# Patient Record
Sex: Female | Born: 1985 | Race: Black or African American | Hispanic: No | Smoking: Former smoker
Health system: Southern US, Community
[De-identification: ages and names within clinical notes are randomized; demographics above are authoritative.]

## PROBLEM LIST (undated history)

## (undated) ENCOUNTER — Inpatient Hospital Stay (HOSPITAL_COMMUNITY): Payer: Self-pay

## (undated) DIAGNOSIS — A599 Trichomoniasis, unspecified: Secondary | ICD-10-CM

## (undated) DIAGNOSIS — G4733 Obstructive sleep apnea (adult) (pediatric): Secondary | ICD-10-CM

## (undated) DIAGNOSIS — N390 Urinary tract infection, site not specified: Secondary | ICD-10-CM

## (undated) DIAGNOSIS — N760 Acute vaginitis: Secondary | ICD-10-CM

## (undated) DIAGNOSIS — I10 Essential (primary) hypertension: Secondary | ICD-10-CM

## (undated) DIAGNOSIS — O139 Gestational [pregnancy-induced] hypertension without significant proteinuria, unspecified trimester: Secondary | ICD-10-CM

## (undated) DIAGNOSIS — F32A Depression, unspecified: Secondary | ICD-10-CM

## (undated) DIAGNOSIS — B9689 Other specified bacterial agents as the cause of diseases classified elsewhere: Secondary | ICD-10-CM

## (undated) DIAGNOSIS — G43909 Migraine, unspecified, not intractable, without status migrainosus: Secondary | ICD-10-CM

## (undated) DIAGNOSIS — Z98891 History of uterine scar from previous surgery: Secondary | ICD-10-CM

## (undated) DIAGNOSIS — E221 Hyperprolactinemia: Secondary | ICD-10-CM

## (undated) DIAGNOSIS — N12 Tubulo-interstitial nephritis, not specified as acute or chronic: Secondary | ICD-10-CM

## (undated) DIAGNOSIS — F319 Bipolar disorder, unspecified: Secondary | ICD-10-CM

## (undated) DIAGNOSIS — E039 Hypothyroidism, unspecified: Secondary | ICD-10-CM

## (undated) DIAGNOSIS — D649 Anemia, unspecified: Secondary | ICD-10-CM

## (undated) DIAGNOSIS — F429 Obsessive-compulsive disorder, unspecified: Secondary | ICD-10-CM

## (undated) DIAGNOSIS — F913 Oppositional defiant disorder: Secondary | ICD-10-CM

## (undated) DIAGNOSIS — N611 Abscess of the breast and nipple: Secondary | ICD-10-CM

## (undated) DIAGNOSIS — K219 Gastro-esophageal reflux disease without esophagitis: Secondary | ICD-10-CM

## (undated) DIAGNOSIS — B009 Herpesviral infection, unspecified: Secondary | ICD-10-CM

## (undated) DIAGNOSIS — Z87448 Personal history of other diseases of urinary system: Secondary | ICD-10-CM

## (undated) DIAGNOSIS — Z86711 Personal history of pulmonary embolism: Secondary | ICD-10-CM

## (undated) DIAGNOSIS — R51 Headache: Secondary | ICD-10-CM

## (undated) DIAGNOSIS — F329 Major depressive disorder, single episode, unspecified: Secondary | ICD-10-CM

## (undated) DIAGNOSIS — G932 Benign intracranial hypertension: Secondary | ICD-10-CM

## (undated) DIAGNOSIS — J302 Other seasonal allergic rhinitis: Secondary | ICD-10-CM

## (undated) DIAGNOSIS — F419 Anxiety disorder, unspecified: Secondary | ICD-10-CM

## (undated) HISTORY — PX: ADENOIDECTOMY: SHX5191

## (undated) HISTORY — PX: WISDOM TOOTH EXTRACTION: SHX21

## (undated) HISTORY — DX: Migraine, unspecified, not intractable, without status migrainosus: G43.909

## (undated) HISTORY — DX: Headache: R51

---
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
## 2007-01-19 ENCOUNTER — Emergency Department (HOSPITAL_COMMUNITY): Admission: EM | Admit: 2007-01-19 | Discharge: 2007-01-20 | Payer: Self-pay | Admitting: Emergency Medicine

## 2007-02-17 HISTORY — PX: CHOLECYSTECTOMY: SHX55

## 2008-02-17 HISTORY — PX: OTHER SURGICAL HISTORY: SHX169

## 2008-04-23 ENCOUNTER — Emergency Department (HOSPITAL_COMMUNITY): Admission: EM | Admit: 2008-04-23 | Discharge: 2008-04-24 | Payer: Self-pay | Admitting: Emergency Medicine

## 2008-07-28 ENCOUNTER — Ambulatory Visit (HOSPITAL_COMMUNITY): Admission: RE | Admit: 2008-07-28 | Discharge: 2008-07-28 | Payer: Self-pay | Admitting: Obstetrics and Gynecology

## 2008-09-11 ENCOUNTER — Ambulatory Visit: Payer: Self-pay | Admitting: Family Medicine

## 2008-09-13 DIAGNOSIS — F319 Bipolar disorder, unspecified: Secondary | ICD-10-CM | POA: Insufficient documentation

## 2008-09-13 DIAGNOSIS — F429 Obsessive-compulsive disorder, unspecified: Secondary | ICD-10-CM | POA: Insufficient documentation

## 2008-09-13 DIAGNOSIS — F988 Other specified behavioral and emotional disorders with onset usually occurring in childhood and adolescence: Secondary | ICD-10-CM | POA: Insufficient documentation

## 2008-09-13 DIAGNOSIS — F913 Oppositional defiant disorder: Secondary | ICD-10-CM | POA: Insufficient documentation

## 2009-03-08 ENCOUNTER — Ambulatory Visit: Payer: Self-pay | Admitting: Pulmonary Disease

## 2009-03-08 DIAGNOSIS — J3089 Other allergic rhinitis: Secondary | ICD-10-CM

## 2009-03-08 DIAGNOSIS — E039 Hypothyroidism, unspecified: Secondary | ICD-10-CM

## 2009-03-08 DIAGNOSIS — G4733 Obstructive sleep apnea (adult) (pediatric): Secondary | ICD-10-CM | POA: Insufficient documentation

## 2009-03-21 ENCOUNTER — Encounter: Payer: Self-pay | Admitting: Pulmonary Disease

## 2009-03-21 ENCOUNTER — Ambulatory Visit (HOSPITAL_BASED_OUTPATIENT_CLINIC_OR_DEPARTMENT_OTHER): Admission: RE | Admit: 2009-03-21 | Discharge: 2009-03-21 | Payer: Self-pay | Admitting: Pulmonary Disease

## 2009-03-27 ENCOUNTER — Telehealth (INDEPENDENT_AMBULATORY_CARE_PROVIDER_SITE_OTHER): Payer: Self-pay | Admitting: *Deleted

## 2009-03-27 ENCOUNTER — Ambulatory Visit: Payer: Self-pay | Admitting: Pulmonary Disease

## 2009-04-08 ENCOUNTER — Ambulatory Visit: Payer: Self-pay | Admitting: Pulmonary Disease

## 2009-06-20 ENCOUNTER — Telehealth: Payer: Self-pay | Admitting: Pulmonary Disease

## 2009-07-17 ENCOUNTER — Encounter: Payer: Self-pay | Admitting: Pulmonary Disease

## 2009-07-24 ENCOUNTER — Emergency Department (HOSPITAL_COMMUNITY): Admission: EM | Admit: 2009-07-24 | Discharge: 2009-07-24 | Payer: Self-pay | Admitting: Emergency Medicine

## 2009-07-24 ENCOUNTER — Telehealth (INDEPENDENT_AMBULATORY_CARE_PROVIDER_SITE_OTHER): Payer: Self-pay | Admitting: *Deleted

## 2009-10-24 ENCOUNTER — Emergency Department (HOSPITAL_COMMUNITY): Admission: EM | Admit: 2009-10-24 | Discharge: 2009-10-24 | Payer: Self-pay | Admitting: Emergency Medicine

## 2009-12-29 ENCOUNTER — Emergency Department (HOSPITAL_COMMUNITY): Admission: EM | Admit: 2009-12-29 | Discharge: 2009-12-29 | Payer: Self-pay | Admitting: Emergency Medicine

## 2009-12-30 ENCOUNTER — Encounter: Admission: RE | Admit: 2009-12-30 | Discharge: 2009-12-30 | Payer: Self-pay | Admitting: Family Medicine

## 2009-12-31 ENCOUNTER — Emergency Department (HOSPITAL_COMMUNITY): Admission: EM | Admit: 2009-12-31 | Discharge: 2009-12-31 | Payer: Self-pay | Admitting: Emergency Medicine

## 2010-02-06 ENCOUNTER — Encounter
Admission: RE | Admit: 2010-02-06 | Discharge: 2010-02-06 | Payer: Self-pay | Source: Home / Self Care | Attending: Family Medicine | Admitting: Family Medicine

## 2010-02-06 ENCOUNTER — Ambulatory Visit (HOSPITAL_COMMUNITY): Admission: RE | Admit: 2010-02-06 | Discharge: 2010-02-07 | Payer: Self-pay | Source: Home / Self Care

## 2010-03-08 ENCOUNTER — Encounter: Payer: Self-pay | Admitting: Family Medicine

## 2010-03-09 ENCOUNTER — Encounter: Payer: Self-pay | Admitting: Family Medicine

## 2010-03-13 ENCOUNTER — Emergency Department (HOSPITAL_COMMUNITY)
Admission: EM | Admit: 2010-03-13 | Discharge: 2010-03-13 | Payer: Self-pay | Source: Home / Self Care | Admitting: Emergency Medicine

## 2010-03-13 LAB — BASIC METABOLIC PANEL
BUN: 3 mg/dL — ABNORMAL LOW (ref 6–23)
CO2: 24 mEq/L (ref 19–32)
Calcium: 9.7 mg/dL (ref 8.4–10.5)
Chloride: 106 mEq/L (ref 96–112)
Creatinine, Ser: 0.8 mg/dL (ref 0.4–1.2)
GFR calc Af Amer: >60 mL/min (ref >60)
GFR calc non Af Amer: >60 mL/min (ref >60)
Glucose, Bld: 104 mg/dL — ABNORMAL HIGH (ref 70–99)
Potassium: 3.5 mEq/L (ref 3.5–5.1)
Sodium: 140 mEq/L (ref 135–145)

## 2010-03-13 LAB — SALICYLATE LEVEL: Salicylate Lvl: <4 mg/dL (ref 2.8–20.0)

## 2010-03-13 LAB — ACETAMINOPHEN LEVEL: Acetaminophen (Tylenol), Serum: <10 ug/mL — ABNORMAL LOW (ref 10–30)

## 2010-03-20 NOTE — Assessment & Plan Note (Signed)
Summary: consult for possible osa   Copy to:  Arsenio Katz @ [facility]/Referring Provider:  Wynelle Link  CC:  Sleep Consult.  History of Present Illness: The pt is a 25y/o female who I have been asked to see for possible osa.  She has been noted to have loud snoring, as well as pauses in her breathing during sleep.  She has also had choking arousals.  She goes to bed btw 2-3am, and arises at 7-8am to start her day.  She is not rested upon arising, and her husband feels she is very restless during sleep with frequent awakenings.  She describes intermittant sleep pressure during the day while reading or watching tv, and does a lot of napping during the day.  She will doze at times while watching tv at night.  She currently does not drive.  The pt states that her weight is up 100 pounds over the last 2 years.  Preventive Screening-Counseling & Management  Alcohol-Tobacco     Smoking Status: current  Current Medications (verified): 1)  Lithium Carbonate 300 Mg Tabs (Lithium Carbonate) .... Take 2 Tabs By Mouth Two Times A Day 2)  Ranitidine Hcl 300 Mg Tabs (Ranitidine Hcl) .... Take 1 Tablet By Mouth Two Times A Day 3)  Risperidone 1 Mg Tabs (Risperidone) .... Take 1 Tablet By Mouth Four Times A Day 4)  Cymbalta 30 Mg Cpep (Duloxetine Hcl) .... Take 1 Tablet By Mouth Two Times A Day 5)  Trihexyphenidyl Hcl 2 Mg Tabs (Trihexyphenidyl Hcl) .... Take 1 Tablet By Mouth Once A Day 6)  Loratadine 10 Mg Tabs (Loratadine) .... Take 1 Tablet By Mouth Once A Day 7)  Clonazepam 1 Mg Tabs (Clonazepam) .... Take 1 Tablet By Mouth Three Times A Day 8)  Levothroid 112 Mcg Tabs (Levothyroxine Sodium) .... Take 1 Tablet By Mouth Once A Day 9)  Metoclopramide Hcl 10 Mg Tabs (Metoclopramide Hcl) .... Take 1 Tablet By Mouth Four Times A Day As Needed For Nausea 10)  Balziva 0.4-35 Mg-Mcg Tabs (Norethindrone-Eth Estradiol) .... Take 1 Tablet By Mouth Once A Day  Allergies (verified): 1)  ! 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 pt was adopted  Social History: Reviewed history and no changes required. Patient is a current smoker.  started at age 64.  1 ppd. pt is married. pt does not have any children. pt is unemployed.  Smoking Status:  current  Review of Systems       The patient complains of shortness of breath with activity, chest pain, acid heartburn, weight change, tooth/dental problems, headaches, itching, anxiety, depression, hand/feet swelling, and rash.  The patient denies shortness of breath at rest, productive cough, non-productive cough, coughing up blood, irregular heartbeats, indigestion, loss of appetite, abdominal pain, difficulty swallowing, sore throat, nasal congestion/difficulty breathing through nose, sneezing, ear ache, joint stiffness or pain, change in color of mucus, and fever.    Vital Signs:  Patient profile:   25 year old female Height:      61 inches Weight:      343.38 pounds BMI:     65.12 O2 Sat:      96 % on Room air Temp:  98.5 degrees F oral Pulse rate:   105 / minute BP sitting:   110 / 82  (right arm) Cuff size:   large (wrist)   Vitals Entered By: Arman Filter LPN (March 08, 2009 9:11 AM)  O2 Flow:  Room air CC: Sleep Consult Comments Medications reviewed with patient Arman Filter LPN  March 08, 2009 9:11 AM    Physical Exam  General:  morbidly obese female in nad Eyes:  PERRLA and EOMI.   Nose:  mild septal deviation to left, turbinate hypertrophy Mouth:  small posterior pharyngeal space, elongation of soft palate  and uvula Neck:  large, difficult to assess for tmg, LN, JVD Lungs:  clear to auscultation Heart:  rrr, no mrg Abdomen:  soft and nontender, bs+ Extremities:  minimal edema, pulses intact distally no cyanosis Neurologic:  alert and oriented, moves all 4.   Impression & Recommendations:  Problem # 1:  OBSTRUCTIVE SLEEP APNEA (ICD-327.23) the pt's history is very suggestive of osa.  She is morbidly obese, has snoring/pauses while sleeping, has nonrestorative sleep, and although she denies a lot of daytime sleepiness, takes a lot of naps throughout the day.  I have had a long discussion with her about the pathophysiology of sleep apnea, including its impact on her QOL and CV health.  I think she needs to have a sleep study at this point for diagnosis.  She also has a h/o hypothyroidism, and tells me that she is due for bloodwork next week to check on control.  I have encouraged her to work on weight loss.  Finally, I have had a discussion with her about her sleep hygeine.  She is going to bed very late and not getting enough sleep, and is making up for it with naps during the day.  I have asked her to place a priority on adequate quantity of sleep.  Medications Added to Medication List This Visit: 1)  Lithium Carbonate 300 Mg Tabs (Lithium carbonate) .... Take 2 tabs by mouth two times a day 2)  Ranitidine Hcl 300 Mg Tabs (Ranitidine hcl) .... Take 1 tablet by mouth two times a day 3)  Risperidone 1 Mg Tabs (Risperidone) .... Take 1 tablet by mouth four times a day 4)  Cymbalta 30 Mg Cpep (Duloxetine hcl) .... Take 1 tablet by mouth two times a day 5)  Trihexyphenidyl Hcl 2 Mg Tabs (Trihexyphenidyl hcl) .... Take 1 tablet by mouth once a day 6)  Loratadine 10 Mg Tabs (Loratadine) .... Take 1 tablet by mouth once a day 7)  Clonazepam 1 Mg Tabs (Clonazepam) .... Take 1 tablet by mouth three times a day 8)  Levothroid 112 Mcg Tabs (Levothyroxine sodium) .... Take 1 tablet by mouth once a day 9)   Metoclopramide Hcl 10 Mg Tabs (Metoclopramide hcl) .... Take 1 tablet by mouth four times a day as needed for nausea 10)  Balziva 0.4-35 Mg-mcg Tabs (Norethindrone-eth estradiol) .... Take 1 tablet by mouth once a day  Other Orders: Consultation Level IV (04540) Sleep Disorder Referral (Sleep Disorder)  Patient Instructions: 1)  will schedule for sleep study, and arrange followup when results available 2)  try to avoid naps 2-3 days before sleep study.

## 2010-03-20 NOTE — Progress Notes (Signed)
Summary: need to sched ov- pt returned call  ---- Converted from flag ---- ---- 03/27/2009 4:10 PM, Arman Filter LPN wrote: pt needs ov with kc to discuss sleep study results. ------------------------------  [facility]......Marland Kitchen  Aundra Millet Reynolds LPN  March 27, 2009 4:11 PM  [id number].  Aundra Millet Reynolds LPN  April 01, 2009 9:57 AM  Phone Note Call from Patient   Caller: Patient Call For: clance Summary of Call: pt already had a f/u appt scheduled with kc for sleep study results. pt is requesting in the meantime that results be faxed to dr Jonny Ruiz beyer at [facility]. pt didn't have a fax # but the office # is [phone]. pt's # is [id number] Initial call taken by: Tivis Ringer, CNA,  April 02, 2009 9:04 AM  Follow-up for Phone Call        Pt informed that sleep study results were faxed to Dr Arsenio Katz at [facility] per pt's request. Abigail Miyamoto RN  April 02, 2009 9:20 AM

## 2010-03-20 NOTE — Progress Notes (Signed)
Summary: rsc nos appt  Phone Note Call from Patient   Caller: [email or web address]  Call For: clance Summary of Call: ATC pt to rsc appt from 5/4 LMTCB X2. Initial call taken by: Darletta Moll,  Jun 20, 2009 3:12 PM

## 2010-03-20 NOTE — Progress Notes (Signed)
  Phone Note Other Incoming   Request: Send information Summary of Call: Request for records received from [facility]. Request forwarded to [facility].     

## 2010-03-20 NOTE — Assessment & Plan Note (Signed)
Summary: rov to review sleep study results.   Copy to:  Arsenio Katz @ [facility]/Referring Provider:  Wynelle Link  CC:  Pt is here for a f/u appt to discuss sleep study results.  .  History of Present Illness: The pt comes in today for f/u after her recent sleep study.  She was found to have mild osa with AHI of 16/hr, and desat to 86%.  I have gone over the study in detail with her, and answered all of her questions.  Current Medications (verified): 1)  Lithium Carbonate 300 Mg Tabs (Lithium Carbonate) .... Take 2 Tabs By Mouth Two Times A Day 2)  Ranitidine Hcl 300 Mg Tabs (Ranitidine Hcl) .... Take 1 Tablet By Mouth Two Times A Day 3)  Risperidone 1 Mg Tabs (Risperidone) .... Take 1 Tablet By Mouth Once A Day 4)  Cymbalta 30 Mg Cpep (Duloxetine Hcl) .... Take 1 Tablet By Mouth Two Times A Day 5)  Trihexyphenidyl Hcl 2 Mg Tabs (Trihexyphenidyl Hcl) .... Take 1 Tablet By Mouth Once A Day 6)  Loratadine 10 Mg Tabs (Loratadine) .... Take 1 Tablet By Mouth Once A Day 7)  Clonazepam 1 Mg Tabs (Clonazepam) .... Take 1 Tablet By Mouth Two Times A Day.  Can Take 1 More Tab By Mouth As Needed 8)  Levothroid 112 Mcg Tabs (Levothyroxine Sodium) .... Take 1 Tablet By Mouth Once A Day 9)  Metoclopramide Hcl 10 Mg Tabs (Metoclopramide Hcl) .... Take 1 Tablet By Mouth Four Times A Day As Needed For Nausea 10)  Balziva 0.4-35 Mg-Mcg Tabs (Norethindrone-Eth Estradiol) .... Take 1 Tablet By Mouth Once A Day  Allergies (verified): 1)  ! Steroids  Vital Signs:  Patient profile:   25 year old female Height:      61 inches Weight:      337.25 pounds BMI:     63.95 O2 Sat:      97 % on Room air Temp:     98.1 degrees F oral Pulse rate:   103 / minute BP sitting:   140 / 84  (right arm) Cuff size:   large  Vitals Entered By: Arman Filter LPN (April 08, 2009 2:12 PM)  O2 Flow:  Room air CC: Pt is here for a f/u appt to discuss sleep study results.   Comments Medications reviewed with  patient Arman Filter LPN  April 08, 2009 2:12 PM    Physical Exam  General:  morbidly obese female in nad   Impression & Recommendations:  Problem # 1:  OBSTRUCTIVE SLEEP APNEA (ICD-327.23) the pt has mild osa by her recent sleep study, but is symptomatic wrt nonrestorative sleep and daytime sleepiness.  Part of the problem is her sleep hygeine, and she will need to work on this.  I have outlined various treatment options for her including a trial of weight loss alone, surgery, dental appliance, and cpap.  She would like to try cpap coupled with a trial of weight loss.  Will optimize treatment after next visit to allow for desensitization.  Time spent with pt today was .  Medications Added to Medication List This Visit: 1)  Risperidone 1 Mg Tabs (Risperidone) .... Take 1 tablet by mouth once a day 2)  Clonazepam 1 Mg Tabs (Clonazepam) .... Take 1 tablet by mouth two times a day.  can take 1 more tab by mouth as needed  Other Orders: Est. Patient Level III (81191) DME Referral (DME)  Patient Instructions: 1)  will start  on cpap as a trial.  Please call if tolerance issues. 2)  work on weight loss 3)  work on getting 7hrs of sleep a night. 4)  followup with me in 4 weeks.   Immunization History:  Influenza Immunization History:    Influenza:  historical (02/17/2007)

## 2010-03-20 NOTE — Letter (Signed)
Summary: CMN for CPAP Supplies/[facility]  CMN for CPAP Supplies/[facility]   Imported By: Sherian Rein 07/26/2009 13:46:26  _____________________________________________________________________  External Attachment:    Type:   Image     Comment:   External Document

## 2010-04-15 ENCOUNTER — Emergency Department (HOSPITAL_COMMUNITY)
Admission: EM | Admit: 2010-04-15 | Discharge: 2010-04-16 | Disposition: A | Discharge status: Home / Self Care | Payer: Medicaid Other | Attending: Emergency Medicine | Admitting: Emergency Medicine

## 2010-04-15 DIAGNOSIS — O99330 Smoking (tobacco) complicating pregnancy, unspecified trimester: Secondary | ICD-10-CM | POA: Insufficient documentation

## 2010-04-15 DIAGNOSIS — O2390 Unspecified genitourinary tract infection in pregnancy, unspecified trimester: Secondary | ICD-10-CM | POA: Insufficient documentation

## 2010-04-15 DIAGNOSIS — A5901 Trichomonal vulvovaginitis: Secondary | ICD-10-CM | POA: Insufficient documentation

## 2010-04-15 DIAGNOSIS — O99891 Other specified diseases and conditions complicating pregnancy: Secondary | ICD-10-CM | POA: Insufficient documentation

## 2010-04-15 DIAGNOSIS — E079 Disorder of thyroid, unspecified: Secondary | ICD-10-CM | POA: Insufficient documentation

## 2010-04-15 DIAGNOSIS — F411 Generalized anxiety disorder: Secondary | ICD-10-CM | POA: Insufficient documentation

## 2010-04-15 DIAGNOSIS — Z79899 Other long term (current) drug therapy: Secondary | ICD-10-CM | POA: Insufficient documentation

## 2010-04-15 DIAGNOSIS — O99340 Other mental disorders complicating pregnancy, unspecified trimester: Secondary | ICD-10-CM | POA: Insufficient documentation

## 2010-04-15 DIAGNOSIS — F319 Bipolar disorder, unspecified: Secondary | ICD-10-CM | POA: Insufficient documentation

## 2010-04-15 DIAGNOSIS — N390 Urinary tract infection, site not specified: Secondary | ICD-10-CM | POA: Insufficient documentation

## 2010-04-15 DIAGNOSIS — E039 Hypothyroidism, unspecified: Secondary | ICD-10-CM | POA: Insufficient documentation

## 2010-04-15 DIAGNOSIS — F952 Tourette's disorder: Secondary | ICD-10-CM | POA: Insufficient documentation

## 2010-04-15 LAB — URINALYSIS, ROUTINE W REFLEX MICROSCOPIC
Nitrite: NEGATIVE
Protein, ur: NEGATIVE mg/dL
Specific Gravity, Urine: 1.027 (ref 1.005–1.030)
Urobilinogen, UA: 0.2 mg/dL (ref 0.0–1.0)
pH: 5.5 (ref 5.0–8.0)

## 2010-04-15 LAB — URINE MICROSCOPIC-ADD ON

## 2010-04-15 LAB — POCT PREGNANCY, URINE: Preg Test, Ur: POSITIVE

## 2010-04-16 ENCOUNTER — Emergency Department (HOSPITAL_COMMUNITY): Payer: Medicaid Other

## 2010-04-16 LAB — TYPE AND SCREEN
ABO/RH(D): B NEG
Antibody Screen: NEGATIVE

## 2010-04-16 LAB — ABO/RH: ABO/RH(D): B NEG

## 2010-04-17 LAB — URINE CULTURE: Culture: NO GROWTH

## 2010-04-28 LAB — CBC
Hemoglobin: 13.1 g/dL (ref 12.0–15.0)
MCHC: 32.5 g/dL (ref 30.0–36.0)
Platelets: 365 10*3/uL (ref 150–400)
RDW: 14.2 % (ref 11.5–15.5)

## 2010-04-28 LAB — ANAEROBIC CULTURE

## 2010-04-28 LAB — SURGICAL PCR SCREEN
MRSA, PCR: NEGATIVE
Staphylococcus aureus: POSITIVE — AB

## 2010-04-28 LAB — CULTURE, ROUTINE-ABSCESS: Gram Stain: NONE SEEN

## 2010-05-01 LAB — URINE MICROSCOPIC-ADD ON

## 2010-05-01 LAB — POCT I-STAT, CHEM 8
BUN: 4 mg/dL — ABNORMAL LOW (ref 6–23)
Creatinine, Ser: 0.7 mg/dL (ref 0.4–1.2)
Glucose, Bld: 99 mg/dL (ref 70–99)
Hemoglobin: 14.3 g/dL (ref 12.0–15.0)
TCO2: 23 mmol/L (ref 0–100)

## 2010-05-01 LAB — URINALYSIS, ROUTINE W REFLEX MICROSCOPIC
Nitrite: NEGATIVE
Specific Gravity, Urine: 1.009 (ref 1.005–1.030)
Urobilinogen, UA: 0.2 mg/dL (ref 0.0–1.0)

## 2010-05-05 LAB — POCT I-STAT, CHEM 8
Calcium, Ion: 1.18 mmol/L (ref 1.12–1.32)
Chloride: 107 mEq/L (ref 96–112)
Creatinine, Ser: 0.8 mg/dL (ref 0.4–1.2)
Glucose, Bld: 84 mg/dL (ref 70–99)
HCT: 37 % (ref 36.0–46.0)
Potassium: 3.7 mEq/L (ref 3.5–5.1)

## 2010-05-05 LAB — URINALYSIS, ROUTINE W REFLEX MICROSCOPIC
Bilirubin Urine: NEGATIVE
Glucose, UA: NEGATIVE mg/dL
Hgb urine dipstick: NEGATIVE
Ketones, ur: NEGATIVE mg/dL
Specific Gravity, Urine: 1.01 (ref 1.005–1.030)
pH: 6.5 (ref 5.0–8.0)

## 2010-05-05 LAB — DIFFERENTIAL
Eosinophils Absolute: 0.4 10*3/uL (ref 0.0–0.7)
Eosinophils Relative: 4 % (ref 0–5)
Lymphs Abs: 3.7 10*3/uL (ref 0.7–4.0)
Monocytes Absolute: 0.5 10*3/uL (ref 0.1–1.0)

## 2010-05-05 LAB — URINE MICROSCOPIC-ADD ON

## 2010-05-05 LAB — CBC
HCT: 35.8 % — ABNORMAL LOW (ref 36.0–46.0)
Hemoglobin: 11.9 g/dL — ABNORMAL LOW (ref 12.0–15.0)
MCV: 89.4 fL (ref 78.0–100.0)
Platelets: 319 10*3/uL (ref 150–400)
RDW: 16.8 % — ABNORMAL HIGH (ref 11.5–15.5)
WBC: 10.7 10*3/uL — ABNORMAL HIGH (ref 4.0–10.5)

## 2010-05-05 LAB — RAPID URINE DRUG SCREEN, HOSP PERFORMED
Barbiturates: NOT DETECTED
Benzodiazepines: NOT DETECTED
Cocaine: NOT DETECTED
Opiates: NOT DETECTED

## 2010-05-05 LAB — POCT PREGNANCY, URINE: Preg Test, Ur: NEGATIVE

## 2010-05-05 LAB — TSH: TSH: 9.844 u[IU]/mL — ABNORMAL HIGH (ref 0.350–4.500)

## 2010-05-14 LAB — ABO/RH: RH Type: NEGATIVE

## 2010-05-14 LAB — HIV ANTIBODY (ROUTINE TESTING W REFLEX): HIV: NONREACTIVE

## 2010-05-14 LAB — HEPATITIS B SURFACE ANTIGEN: Hepatitis B Surface Ag: NEGATIVE

## 2010-05-14 LAB — GC/CHLAMYDIA PROBE AMP, GENITAL: Gonorrhea: NEGATIVE

## 2010-05-20 DIAGNOSIS — O26899 Other specified pregnancy related conditions, unspecified trimester: Secondary | ICD-10-CM | POA: Diagnosis not present

## 2010-05-20 DIAGNOSIS — R109 Unspecified abdominal pain: Secondary | ICD-10-CM | POA: Diagnosis not present

## 2010-05-29 LAB — DIFFERENTIAL
Basophils Absolute: 0.1 10*3/uL (ref 0.0–0.1)
Basophils Relative: 1 % (ref 0–1)
Eosinophils Relative: 5 % (ref 0–5)
Lymphocytes Relative: 29 % (ref 12–46)
Neutro Abs: 5.7 10*3/uL (ref 1.7–7.7)

## 2010-05-29 LAB — CBC
MCHC: 33.2 g/dL (ref 30.0–36.0)
MCV: 89.3 fL (ref 78.0–100.0)
Platelets: 280 10*3/uL (ref 150–400)
RBC: 4.14 MIL/uL (ref 3.87–5.11)
WBC: 9.5 10*3/uL (ref 4.0–10.5)

## 2010-05-29 LAB — COMPREHENSIVE METABOLIC PANEL
AST: 17 U/L (ref 0–37)
BUN: 8 mg/dL (ref 6–23)
CO2: 28 mEq/L (ref 19–32)
Chloride: 106 mEq/L (ref 96–112)
Creatinine, Ser: 0.73 mg/dL (ref 0.4–1.2)
GFR calc non Af Amer: >60 mL/min (ref >60)
Total Bilirubin: 0.6 mg/dL (ref 0.3–1.2)

## 2010-05-29 LAB — URINALYSIS, ROUTINE W REFLEX MICROSCOPIC
Bilirubin Urine: NEGATIVE
Glucose, UA: NEGATIVE mg/dL
Ketones, ur: NEGATIVE mg/dL
Nitrite: NEGATIVE
Specific Gravity, Urine: 1.011 (ref 1.005–1.030)
pH: 7 (ref 5.0–8.0)

## 2010-05-29 LAB — LIPASE, BLOOD: Lipase: 18 U/L (ref 11–59)

## 2010-05-29 LAB — URINE MICROSCOPIC-ADD ON
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
## 2010-07-04 NOTE — Op Note (Signed)
Grace Simpson, Grace Simpson               ACCOUNT NO.:  [id number]   MEDICAL RECORD NO.:  [id number]          PATIENT TYPE:  AMB   LOCATION:  DAY                          FACILITY:  [facility]   PHYSICIAN:  Timothy E. Earlene Plater, M.D. DATE OF BIRTH:  1985/02/19   DATE OF PROCEDURE:  08/18/2005  DATE OF DISCHARGE:                                 OPERATIVE REPORT   PREOPERATIVE DIAGNOSIS:  Cholecystolithiasis.   POSTOPERATIVE DIAGNOSIS:  Cholecystolithiasis.   PROCEDURE:  Laparoscopic cholecystectomy and operative cholangiogram.   SURGEON:  Timothy E. Earlene Plater, M.D.   ASSISTANT:  Wilmon Arms. Tsuei, M.D.   ANESTHESIA:  General.   [name] is here accompanied by her mother.  She is an 25 year old female  with bipolar disorder and obesity.  She had her first episode of acute  cholecystolithiasis in early June and after careful consultation and  consideration, she wishes to proceed and her mother agrees.  She is seen,  identified, and the permit signed.  Laboratory data are normal today.  Pregnancy test was negative.   She is taken to the operating room, placed supine, general endotracheal  anesthesia administered.  The abdomen was prepped and draped in the usual  fashion.  Marcaine 0.25% with epinephrine is used prior to each incision.  An infraumbilical incision made vertically, the fascia identified,  peritoneum entered without complication.  Hasson catheter placed and tied in  place with #1 Vicryl.  The abdomen insufflated.  General peritoneoscopy was  unremarkable.  A second 10 mm trocar placed in the midepigastrium and two 5  mm trocars in the right upper quadrant.  The gallbladder was tense.  There  were some adhesions to the omentum.  It was grasped and retracted, the  adhesions taken down and the full gallbladder scrutinized, visualized, and  then a careful dissection at the base of the gallbladder revealed a single  normal-appearing cystic duct.  This was isolated, dissected out, a clip was  placed on the gallbladder side.  The cystic duct was opened and then a  catheter passed percutaneously was placed into the cystic duct remnant and a  clip applied and then using half-strength was dry and real-time fluoroscopy,  a cholangiogram was made showing full filling of the biliary tree with free  flow of dye into the duodenum.  I detected no abnormality.  The clip and  catheter were removed, the stump of the cystic duct triply clipped and  divided.  The cystic artery isolated and triply clipped and divided, and the  gallbladder removed from the gallbladder bed without significant  complications, though a couple of nicks were made in the gallbladder.  There  was some leak of bile but no stones.  The bile was quickly suctioned away,  the gallbladder was removed from the gallbladder bed, put into an EndoCatch  bag.  Copious irrigation was carried out, the liver bed inspected and was  uncomplicated.  The gallbladder removed from the abdomen through the  infraumbilical incision, which was closed.  The abdomen reinsufflated,  copious irrigation carried out until clear.  No obvious complications.  All  irrigant,  CO2, instruments and trocars were removed under direct vision.  Final counts correct.  She tolerated it well.  The wound was closed with  Monocryl and Steri-Strips, and she was removed to the recovery room in good  condition.      Timothy E. Earlene Plater, M.D.  Electronically Signed     TED/MEDQ  D:  08/18/2005  T:  08/18/2005  Job:  [id number]   cc:   Dr. Elon Jes[facility]

## 2010-07-04 NOTE — Discharge Summary (Signed)
NAME:  Grace Simpson, Grace Simpson                         ACCOUNT NO.:  [id number]   MEDICAL RECORD NO.:  [id number]                   PATIENT TYPE:  INP   LOCATION:  0199                                 [facility]ACILITY:  [facility]   PHYSICIAN:  Cindie Crumbly, M.D.               DATE OF BIRTH:  1985-03-04   DATE OF ADMISSION:  03/05/2002  DATE OF DISCHARGE:  03/10/2002                                 DISCHARGE SUMMARY   REASON FOR ADMISSION:  This 25 year old female was admitted for inpatient  psychiatric stabilization because of increasing assaultive behavior and  threats of suicide.  For further history of present illness, please see the  patient's psychiatric admission assessment.   PHYSICAL EXAMINATION:  At the time of admission was significant for acne  vulgaris and obesity.  She had an otherwise unremarkable physical  examination and was completing a course of Flagyl for a urinary tract  infection.   LABORATORY DATA:  The patient underwent a laboratory workup to rule out any  other medical problems contributing to her symptomatology.  Urine probe for  gonorrhea and chlamydia were negative.  CBC showed RBC count of 3.67, MCV of  92.3, MCHC of 34.5 and was otherwise unremarkable.  Basic metabolic panel  showed a potassium of 3.4, BUN 5 and albumin of 3.3 and was otherwise  unremarkable.  Hepatic panel was within normal limits.  GGT was 66.  TSH and  free T4 were within normal limits.  Urine pregnancy test was negative.  Lithium level, on admission, was 1.44.  The patient displayed no evidence of  side effects from taking lithium.  A repeat lithium level, at discharge, is  pending.  Urine drug screen was negative.  UA was unremarkable.  RPR was  nonreactive.  The patient received no x-rays, no special procedures, no  additional consultations.  She sustained no complications during the course  of this hospitalization.   HOSPITAL COURSE:  On admission, the patient was psychomotor agitated.  Affect  and mood were depressed and irritable.  Her concentration was  decreased.  She showed poor impulse control.  She displayed an excessive  reliance on borderline, histrionic, antisocial defense mechanisms.  She had  limited insight and was not invested in treatment.  She was continued on  Lithobid and clonazepam.  Lamictal was added to her medication because of  significant mood swings and depression.  At the time of discharge, the  patient denies any suicidal or homicidal ideation.  Her affect and mood have  improved.  Her concentration has increased.  She is actively participating  in all aspects of the therapeutic treatment program.  She no longer appears  to be a danger to herself or others and, consequently, is felt to have  reached her maximum benefits of hospitalization.   CONDITION ON DISCHARGE:  Improved.   DIAGNOSES (ACCORDING TO DSM-IV):   AXIS I:  1. Bipolar disorder, mixed-type, severe  without psychosis.  2. Conduct disorder.  3. Attention-deficit hyperactivity disorder.  4. Rule out schizoaffective disorder.   AXIS II:  1. Histrionic, borderline and antisocial traits.  2. Rule out personality disorder not otherwise specified.  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   AXIS III:  1. Obesity.  2. Acne vulgaris.   AXIS IV:  Current psychosocial stressors are severe.   AXIS V:  20 on admission; 30 on discharge.   FURTHER EVALUATION AND TREATMENT RECOMMENDATIONS:  1. The patient is discharged to the custody of her group home.  2. She is discharged on an unrestricted level of activity and a regular     diet.  3. She is discharged on Lithobid 300 mg in the morning and 600 mg q.h.s.     Her lithium level, at the time of discharge, has just become available     and that is 1.2.  As noted above, the patient has had no side effects     from her lithium.  The patient is also taking Lamictal 50 mg p.o. q.h.s.     and this will continue to be needed to titrate  upward by her outpatient     psychiatrist to achieve a therapeutic level.  The patient is also     discharged on clonazepam 1 mg p.o. t.i.d., Yasmin 28-day birth control     tablets, 1 p.o. q.d. during her cycle, Allegra 180 mg p.o. q.d.  4. She will follow up with Dr. Toni Arthurs for all further aspects of her     psychiatric care and, consequently, I will sign off on the case at this     time.  She will follow up with her primary care physician for all further     aspects of her medical care.                                               Cindie Crumbly, M.D.    TS/MEDQ  D:  03/10/2002  T:  03/11/2002  Job:  [id number]

## 2010-07-04 NOTE — Discharge Summary (Signed)
[facility]  Patient:    Grace Simpson, Grace Simpson Visit Number: [id number] MRN: [id number]          Service Type: PSY Location: 10 0102 01 Attending Physician:  Veneta Penton Dictated by:   Veneta Penton, M.D. Admit Date:  11/19/2000 Disc. Date: 11/26/00                             Discharge Summary  REASON FOR ADMISSION:  This 25 year old female was admitted because of increasing threats of suicide and aggression.  For further history of present illness, please see the patients psychiatric admission assessment.  PHYSICAL EXAMINATION:  At the time of admission was significant for facial acne and morbid obesity.  She had an otherwise unremarkable physical examination.  LABORATORY EXAMINATION:  The patient underwent a laboratory workup to rule out any medical problems contributing to her symptomatology.  A CBC showed an RDW of 14.1% and was otherwise unremarkable.  Urine probe for gonorrhea and chlamydia were negative.  GGT was within normal limits.  Basic metabolic panel was within normal limits.  Hepatic panel showed a total protein of 8.1 and was otherwise unremarkable.  TSH and free T4 were within normal limits.  Urine pregnancy test was negative.  Urine drug screen was negative.  Lithium level was less than 0.25.  UA was unremarkable.  RPR was nonreactive.  The patient received no x-rays, no special procedures, no additional consultations.  She sustained no complications during the course of this hospitalization.  HOSPITAL COURSE:  On admission, the patient presented as psychomotor retarded. Her speech was soft and slow.  Her affect and mood were depressed.  She admitted to suicidal ideation.  She was continued on her admission medications for bipolar disorder, which included Trileptal, Geodon, Klonopin and Lithobid. Her affect and mood gradually improved.  Her concentration increased.  At the time of discharge, she denies any suicidal or  homicidal ideation.  She is able to perform all her activities of daily living.  She no longer appears to be a danger to herself or others.  She is actively participating in all aspects of the therapeutic treatment program.  Consequently, it is felt she has reached her maximum benefits of hospitalization and is ready for discharge to a less restrictive alternative setting.  CONDITION ON DISCHARGE:  Improved.  DIAGNOSES:  (According to DSM-IV). Axis I:    1. Bipolar disorder, mixed-type, severe without psychosis.            2. Attention-deficit hyperactivity disorder, by history.            3. Conduct disorder.            4. Rule out obsessive-compulsive disorder.            5. Rule out major depression. Axis II:   Rule out learning disorder not otherwise specified. Axis III:  1. Obesity.            2. Facial acne probably secondary to lithium. Axis IV:   Current psychosocial stressors are severe. Axis V:    20 on admission; 30 on discharge.  FURTHER EVALUATION AND TREATMENT RECOMMENDATIONS: 1. The patient is discharged to home. 2. She will follow up in [facility] for her    continued educational setting.  She will follow up with Dr. Clovis Fredrickson,    her outpatient psychiatrist, for all further aspects of her psychiatric  care and I will sign off on the case at this time. 3. The patient is discharged on an unrestricted level of activity and a    regular diet. 4. She is discharged on Trileptal 600 mg p.o. b.i.d., Geodon 60 mg p.o.    b.i.d., Klonopin 0.5 mg p.o. q.d., Lithobid 150 mg p.o. b.i.d., Allegra    120 mg p.o. q.d. Dictated by:   Veneta Penton, M.D. Attending Physician:  Veneta Penton DD:  11/26/00 TD:  11/26/00 Job: [id number]
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
## 2010-07-19 ENCOUNTER — Inpatient Hospital Stay (HOSPITAL_COMMUNITY)
Admission: EM | Admit: 2010-07-19 | Discharge: 2010-07-19 | Disposition: A | Discharge status: Home / Self Care | Payer: Medicaid Other | Source: Ambulatory Visit | Attending: Obstetrics and Gynecology | Admitting: Obstetrics and Gynecology

## 2010-07-19 DIAGNOSIS — K625 Hemorrhage of anus and rectum: Secondary | ICD-10-CM | POA: Insufficient documentation

## 2010-07-19 DIAGNOSIS — O99891 Other specified diseases and conditions complicating pregnancy: Secondary | ICD-10-CM | POA: Insufficient documentation

## 2010-07-19 LAB — URINALYSIS, ROUTINE W REFLEX MICROSCOPIC
Bilirubin Urine: NEGATIVE
Nitrite: NEGATIVE
Protein, ur: NEGATIVE mg/dL
Specific Gravity, Urine: 1.015 (ref 1.005–1.030)
Urobilinogen, UA: 0.2 mg/dL (ref 0.0–1.0)

## 2010-07-19 LAB — URINE MICROSCOPIC-ADD ON

## 2010-07-21 LAB — URINE CULTURE
Colony Count: 50000
Culture  Setup Time: 201206030112

## 2010-08-11 ENCOUNTER — Inpatient Hospital Stay (HOSPITAL_COMMUNITY)
Admission: AD | Admit: 2010-08-11 | Discharge: 2010-08-12 | Disposition: A | Discharge status: Home / Self Care | Payer: Medicaid Other | Source: Ambulatory Visit | Attending: Obstetrics and Gynecology | Admitting: Obstetrics and Gynecology

## 2010-08-11 DIAGNOSIS — N390 Urinary tract infection, site not specified: Secondary | ICD-10-CM | POA: Insufficient documentation

## 2010-08-11 DIAGNOSIS — O2390 Unspecified genitourinary tract infection in pregnancy, unspecified trimester: Secondary | ICD-10-CM | POA: Insufficient documentation

## 2010-08-11 DIAGNOSIS — F411 Generalized anxiety disorder: Secondary | ICD-10-CM | POA: Insufficient documentation

## 2010-08-11 DIAGNOSIS — O99340 Other mental disorders complicating pregnancy, unspecified trimester: Secondary | ICD-10-CM | POA: Insufficient documentation

## 2010-08-11 DIAGNOSIS — R109 Unspecified abdominal pain: Secondary | ICD-10-CM | POA: Insufficient documentation

## 2010-08-12 LAB — URINE MICROSCOPIC-ADD ON

## 2010-08-12 LAB — URINALYSIS, ROUTINE W REFLEX MICROSCOPIC
Bilirubin Urine: NEGATIVE
Ketones, ur: NEGATIVE mg/dL
Nitrite: POSITIVE — AB
Protein, ur: 100 mg/dL — AB
Specific Gravity, Urine: >1.03 — ABNORMAL HIGH (ref 1.005–1.030)
Urobilinogen, UA: 1 mg/dL (ref 0.0–1.0)

## 2010-08-13 ENCOUNTER — Inpatient Hospital Stay (HOSPITAL_COMMUNITY)
Admission: AD | Admit: 2010-08-13 | Discharge: 2010-08-13 | Disposition: A | Discharge status: Home / Self Care | Payer: Medicaid Other | Source: Ambulatory Visit | Attending: Obstetrics and Gynecology | Admitting: Obstetrics and Gynecology

## 2010-08-13 DIAGNOSIS — K649 Unspecified hemorrhoids: Secondary | ICD-10-CM

## 2010-08-13 DIAGNOSIS — O228X9 Other venous complications in pregnancy, unspecified trimester: Secondary | ICD-10-CM

## 2010-08-13 LAB — URINE MICROSCOPIC-ADD ON

## 2010-08-13 LAB — URINALYSIS, ROUTINE W REFLEX MICROSCOPIC
Specific Gravity, Urine: 1.025 (ref 1.005–1.030)
pH: 6.5 (ref 5.0–8.0)

## 2010-08-14 LAB — URINE CULTURE: Culture  Setup Time: 201206260647

## 2010-08-15 ENCOUNTER — Encounter: Payer: Self-pay | Admitting: Obstetrics and Gynecology

## 2010-08-19 ENCOUNTER — Other Ambulatory Visit (HOSPITAL_COMMUNITY): Payer: Self-pay | Admitting: Obstetrics and Gynecology

## 2010-08-25 ENCOUNTER — Emergency Department (HOSPITAL_COMMUNITY)
Admission: EM | Admit: 2010-08-25 | Discharge: 2010-08-25 | Disposition: A | Discharge status: Home / Self Care | Payer: Medicaid Other | Attending: Emergency Medicine | Admitting: Emergency Medicine

## 2010-08-25 DIAGNOSIS — O99891 Other specified diseases and conditions complicating pregnancy: Secondary | ICD-10-CM | POA: Insufficient documentation

## 2010-08-25 DIAGNOSIS — S0003XA Contusion of scalp, initial encounter: Secondary | ICD-10-CM | POA: Insufficient documentation

## 2010-08-25 DIAGNOSIS — E079 Disorder of thyroid, unspecified: Secondary | ICD-10-CM | POA: Insufficient documentation

## 2010-08-25 DIAGNOSIS — O99280 Endocrine, nutritional and metabolic diseases complicating pregnancy, unspecified trimester: Secondary | ICD-10-CM | POA: Insufficient documentation

## 2010-08-25 DIAGNOSIS — H9319 Tinnitus, unspecified ear: Secondary | ICD-10-CM | POA: Insufficient documentation

## 2010-08-25 DIAGNOSIS — E039 Hypothyroidism, unspecified: Secondary | ICD-10-CM | POA: Insufficient documentation

## 2010-08-25 DIAGNOSIS — S1093XA Contusion of unspecified part of neck, initial encounter: Secondary | ICD-10-CM | POA: Insufficient documentation

## 2010-08-25 DIAGNOSIS — Z87891 Personal history of nicotine dependence: Secondary | ICD-10-CM | POA: Insufficient documentation

## 2010-08-25 DIAGNOSIS — K219 Gastro-esophageal reflux disease without esophagitis: Secondary | ICD-10-CM | POA: Insufficient documentation

## 2010-08-25 DIAGNOSIS — Z79899 Other long term (current) drug therapy: Secondary | ICD-10-CM | POA: Insufficient documentation

## 2010-08-28 ENCOUNTER — Encounter (HOSPITAL_COMMUNITY): Payer: Self-pay

## 2010-08-28 ENCOUNTER — Ambulatory Visit (HOSPITAL_COMMUNITY)
Admission: RE | Admit: 2010-08-28 | Discharge: 2010-08-28 | Disposition: A | Discharge status: Home / Self Care | Payer: Medicaid Other | Source: Ambulatory Visit | Attending: Obstetrics and Gynecology | Admitting: Obstetrics and Gynecology

## 2010-08-28 DIAGNOSIS — E079 Disorder of thyroid, unspecified: Secondary | ICD-10-CM | POA: Insufficient documentation

## 2010-08-28 DIAGNOSIS — E039 Hypothyroidism, unspecified: Secondary | ICD-10-CM | POA: Insufficient documentation

## 2010-08-28 DIAGNOSIS — E669 Obesity, unspecified: Secondary | ICD-10-CM | POA: Insufficient documentation

## 2010-08-28 DIAGNOSIS — O99210 Obesity complicating pregnancy, unspecified trimester: Secondary | ICD-10-CM | POA: Insufficient documentation

## 2010-08-28 DIAGNOSIS — O99280 Endocrine, nutritional and metabolic diseases complicating pregnancy, unspecified trimester: Secondary | ICD-10-CM | POA: Insufficient documentation

## 2010-09-03 ENCOUNTER — Other Ambulatory Visit (HOSPITAL_COMMUNITY): Payer: Self-pay | Admitting: Obstetrics and Gynecology

## 2010-09-03 DIAGNOSIS — E669 Obesity, unspecified: Secondary | ICD-10-CM

## 2010-09-16 ENCOUNTER — Inpatient Hospital Stay (HOSPITAL_COMMUNITY)
Admission: AD | Admit: 2010-09-16 | Discharge: 2010-09-17 | Disposition: A | Discharge status: Home / Self Care | Payer: Medicaid Other | Source: Ambulatory Visit | Attending: Obstetrics and Gynecology | Admitting: Obstetrics and Gynecology

## 2010-09-16 DIAGNOSIS — M545 Low back pain, unspecified: Secondary | ICD-10-CM | POA: Insufficient documentation

## 2010-09-16 DIAGNOSIS — O9989 Other specified diseases and conditions complicating pregnancy, childbirth and the puerperium: Secondary | ICD-10-CM | POA: Insufficient documentation

## 2010-09-16 DIAGNOSIS — O212 Late vomiting of pregnancy: Secondary | ICD-10-CM | POA: Insufficient documentation

## 2010-09-16 DIAGNOSIS — O26899 Other specified pregnancy related conditions, unspecified trimester: Secondary | ICD-10-CM

## 2010-09-16 DIAGNOSIS — R1033 Periumbilical pain: Secondary | ICD-10-CM | POA: Insufficient documentation

## 2010-09-16 HISTORY — DX: Essential (primary) hypertension: I10

## 2010-09-17 ENCOUNTER — Encounter (HOSPITAL_COMMUNITY): Payer: Self-pay | Admitting: *Deleted

## 2010-09-17 LAB — CBC
Hemoglobin: 12.2 g/dL (ref 12.0–15.0)
MCH: 31 pg (ref 26.0–34.0)
Platelets: 251 10*3/uL (ref 150–400)
RBC: 3.93 MIL/uL (ref 3.87–5.11)
WBC: 8.7 10*3/uL (ref 4.0–10.5)

## 2010-09-17 LAB — URINALYSIS, ROUTINE W REFLEX MICROSCOPIC
Bilirubin Urine: NEGATIVE
Nitrite: NEGATIVE
Specific Gravity, Urine: 1.025 (ref 1.005–1.030)
Urobilinogen, UA: 0.2 mg/dL (ref 0.0–1.0)
pH: 6 (ref 5.0–8.0)

## 2010-09-17 MED ORDER — ONDANSETRON 8 MG PO TBDP
8.0000 mg | ORAL_TABLET | Freq: Once | ORAL | Status: AC
  Administered 2010-09-17: 8 mg via ORAL
  Filled 2010-09-17: qty 1

## 2010-09-17 NOTE — Progress Notes (Signed)
Jenean Lindau, PA at bedside.  Discussing discharge with pt.

## 2010-09-17 NOTE — Progress Notes (Signed)
Pt to bathroom for urine sample.

## 2010-09-17 NOTE — Progress Notes (Signed)
Pt presents to [facility] for c/o abdominal pain.  Dr Ellyn Hack had called about pt with orders for ffn.  Pt has had intercourse in last 24 hours.

## 2010-09-17 NOTE — Progress Notes (Signed)
Pt states she has been contractioning/cramping for the last few hours

## 2010-09-17 NOTE — Progress Notes (Signed)
E. Rice, PA at bedside.  Assessment done and poc discussed with pt.   

## 2010-09-17 NOTE — ED Provider Notes (Signed)
History    patient's 25 year old black female. She is gravida 1 at approximately 28.[redacted] weeks pregnant. She presents today complaining of pain around the umbilicus and low back. She also complains of nausea and vomiting. He states his pain has been going on for the past 2 days. Her most recent episode of intercourse was earlier today. She denies vaginal discharge or bleeding. She states she was recently diagnosed with a sinus infection.  Chief Complaint  Patient presents with  . Contractions   HPI  OB History    Grav Para Term Preterm Abortions TAB SAB Ect Mult Living   1               Past Medical History  Diagnosis Date  . Hypertension     Past Surgical History  Procedure Date  . Tonsillectomy   . Left breast abcess   . Cholecystectomy     No family history on file.  History  Substance Use Topics  . Smoking status: Never Smoker   . Smokeless tobacco: Not on file  . Alcohol Use: No    Allergies:  Allergies  Allergen Reactions  . Benadryl Allergy     Adverse effect  . Corticosteroids     Adverse effect    Prescriptions prior to admission  Medication Sig Dispense Refill  . acetaminophen (TYLENOL) 500 MG tablet Take 1,000 mg by mouth every 6 (six) hours as needed. pain       . amoxicillin (AMOXIL) 875 MG tablet Take 875 mg by mouth 2 (two) times daily.        Marland Kitchen HYDROcodone-acetaminophen (NORCO) 5-325 MG per tablet Take 1 tablet by mouth every 4 (four) hours as needed. pain       . levothyroxine (SYNTHROID, LEVOTHROID) 112 MCG tablet Take 112 mcg by mouth daily.        Marland Kitchen omeprazole (PRILOSEC OTC) 20 MG tablet Take 20 mg by mouth daily.        . prenatal vitamin w/FE, FA (PRENATAL 1 + 1) 27-1 MG TABS Take 1 tablet by mouth at bedtime.        . promethazine (PHENERGAN) 25 MG tablet Take 25 mg by mouth every 6 (six) hours as needed. nausea         Review of Systems  Constitutional: Negative for fever, chills and malaise/fatigue.  Respiratory: Negative for cough,  hemoptysis, sputum production, shortness of breath and wheezing.   Cardiovascular: Negative for chest pain.  Gastrointestinal: Positive for nausea, vomiting and abdominal pain. Negative for diarrhea, constipation, blood in stool and melena.  Genitourinary: Negative for dysuria, urgency, frequency, hematuria and flank pain.  Neurological: Negative for dizziness and headaches.  Psychiatric/Behavioral: Negative for depression and suicidal ideas.   Physical Exam   Blood pressure 141/85, pulse 100, temperature 97.7 F (36.5 C), temperature source Oral, resp. rate 22, height 6\' 1"  (1.854 m), weight 288 lb (130.636 kg).  Physical Exam  Constitutional: She is oriented to person, place, and time. She appears well-developed and well-nourished. No distress.  HENT:  Head: Normocephalic and atraumatic.  Eyes: EOM are normal. Pupils are equal, round, and reactive to light.  GI: Soft. She exhibits no distension and no mass. There is no tenderness. There is no rebound and no guarding.  Genitourinary: No vaginal discharge found.       Cervix is long and closed. Patient is nontender on exam. Exam is technically difficult secondary to increased body habitus.  Neurological: She is alert and oriented to person, place,  and time.  Skin: Skin is warm and dry. She is not diaphoretic.  Psychiatric: She has a normal mood and affect. Her behavior is normal. Judgment and thought content normal.    MAU Course  Procedures  NST is reactive for 28 weeks with no contractions.  Results for orders placed during the hospital encounter of 09/16/10 (from the past 24 hour(s))  URINALYSIS, ROUTINE W REFLEX MICROSCOPIC     Status: Normal   Collection Time   09/17/10 12:48 AM      Component Value Range   Color, Urine YELLOW  YELLOW    Appearance CLEAR  CLEAR    Specific Gravity, Urine 1.025  1.005 - 1.030    pH 6.0  5.0 - 8.0    Glucose, UA NEGATIVE  NEGATIVE (mg/dL)   Hgb urine dipstick NEGATIVE  NEGATIVE    Bilirubin  Urine NEGATIVE  NEGATIVE    Ketones, ur NEGATIVE  NEGATIVE (mg/dL)   Protein, ur NEGATIVE  NEGATIVE (mg/dL)   Urobilinogen, UA 0.2  0.0 - 1.0 (mg/dL)   Nitrite NEGATIVE  NEGATIVE    Leukocytes, UA NEGATIVE  NEGATIVE   CBC     Status: Abnormal   Collection Time   09/17/10  1:12 AM      Component Value Range   WBC 8.7  4.0 - 10.5 (K/uL)   RBC 3.93  3.87 - 5.11 (MIL/uL)   Hemoglobin 12.2  12.0 - 15.0 (g/dL)   HCT 56.2 (*) 13.0 - 46.0 (%)   MCV 90.3  78.0 - 100.0 (fL)   MCH 31.0  26.0 - 34.0 (pg)   MCHC 34.4  30.0 - 36.0 (g/dL)   RDW 86.5  78.4 - 69.6 (%)   Platelets 251  150 - 400 (K/uL)     Assessment and Plan  Abdominal pain in pregnancy: At this time I think the patient is having abdominal pain for nausea and vomiting. There is no sign of preterm labor. She has a prescription for Phenergan home. She will followup and with her OB provider. I did discuss with her appropriate diet, activities, risks and precautions.  Clinton Gallant. Rice III, DrHSc, MPAS, PA-C  09/17/2010, 1:04 AM   Henrietta Hoover, PA 09/17/10 [id number]

## 2010-09-25 ENCOUNTER — Ambulatory Visit (HOSPITAL_COMMUNITY)
Admission: RE | Admit: 2010-09-25 | Discharge: 2010-09-25 | Disposition: A | Discharge status: Home / Self Care | Payer: Medicaid Other | Source: Ambulatory Visit | Attending: Obstetrics and Gynecology | Admitting: Obstetrics and Gynecology

## 2010-09-25 VITALS — BP 136/86 | HR 98 | Wt 289.0 lb

## 2010-09-25 DIAGNOSIS — Z3689 Encounter for other specified antenatal screening: Secondary | ICD-10-CM | POA: Insufficient documentation

## 2010-09-25 DIAGNOSIS — E669 Obesity, unspecified: Secondary | ICD-10-CM | POA: Insufficient documentation

## 2010-09-25 NOTE — Progress Notes (Signed)
Ultrasound in [facility]/EP[facility].  Follow up U/S scheduled 3 weeks. 

## 2010-09-25 NOTE — Progress Notes (Signed)
Encounter addended by: Marlana Latus, RN on: 09/25/2010 11:34 AM<BR>     Documentation filed: Episodes, Chief Complaint Section

## 2010-09-26 ENCOUNTER — Inpatient Hospital Stay (HOSPITAL_COMMUNITY)
Admission: AD | Admit: 2010-09-26 | Discharge: 2010-09-26 | Disposition: A | Discharge status: Home / Self Care | Payer: Medicaid Other | Source: Ambulatory Visit | Attending: Obstetrics and Gynecology | Admitting: Obstetrics and Gynecology

## 2010-09-26 ENCOUNTER — Encounter (HOSPITAL_COMMUNITY): Payer: Self-pay | Admitting: *Deleted

## 2010-09-26 DIAGNOSIS — Z711 Person with feared health complaint in whom no diagnosis is made: Secondary | ICD-10-CM

## 2010-09-26 DIAGNOSIS — Z3689 Encounter for other specified antenatal screening: Secondary | ICD-10-CM

## 2010-09-26 DIAGNOSIS — O368190 Decreased fetal movements, unspecified trimester, not applicable or unspecified: Secondary | ICD-10-CM | POA: Insufficient documentation

## 2010-09-26 DIAGNOSIS — Z36 Encounter for antenatal screening of mother: Secondary | ICD-10-CM

## 2010-09-26 HISTORY — DX: Morbid (severe) obesity due to excess calories: E66.01

## 2010-09-26 HISTORY — DX: Obstructive sleep apnea (adult) (pediatric): G47.33

## 2010-09-26 HISTORY — DX: Obsessive-compulsive disorder, unspecified: F42.9

## 2010-09-26 HISTORY — DX: Bipolar disorder, unspecified: F31.9

## 2010-09-26 HISTORY — DX: Hypothyroidism, unspecified: E03.9

## 2010-09-26 HISTORY — DX: Oppositional defiant disorder: F91.3

## 2010-09-26 NOTE — ED Provider Notes (Signed)
History     Chief Complaint  Patient presents with  . Decreased Fetal Movement   HPI Presents with c/o no fetal movement for 2 hours earlier. States tried to get the baby to move without success.  No contractions, leaking or bleeding.    Past Medical History  Diagnosis Date  . Hypertension   . Hypothyroidism   . Obesity, morbid (more than 100 lbs over ideal weight or BMI > 40)   . Bipolar affective   . OCD (obsessive compulsive disorder)   . Oppositional defiant disorder   . Obstructive sleep apnea     Past Surgical History  Procedure Date  . Tonsillectomy   . Left breast abcess   . Cholecystectomy     Family History  Problem Relation Age of Onset  . Adopted: Yes    History  Substance Use Topics  . Smoking status: Former Games developer  . Smokeless tobacco: Not on file  . Alcohol Use: No    Allergies:  Allergies  Allergen Reactions  . Benadryl Allergy Other (See Comments)    Adverse effect  . Corticosteroids Other (See Comments)    Adverse effect    Prescriptions prior to admission  Medication Sig Dispense Refill  . acetaminophen (TYLENOL) 500 MG tablet Take 1,000 mg by mouth every 6 (six) hours as needed. pain       . amoxicillin (AMOXIL) 875 MG tablet Take 875 mg by mouth 2 (two) times daily.        Marland Kitchen HYDROcodone-acetaminophen (NORCO) 5-325 MG per tablet Take 1 tablet by mouth every 4 (four) hours as needed. pain       . levothyroxine (SYNTHROID, LEVOTHROID) 112 MCG tablet Take 112 mcg by mouth daily.        . metroNIDAZOLE (FLAGYL) 500 MG tablet Take 500 mg by mouth 2 (two) times daily. Take for 7 days       . omeprazole (PRILOSEC OTC) 20 MG tablet Take 20 mg by mouth daily.        . prenatal vitamin w/FE, FA (PRENATAL 1 + 1) 27-1 MG TABS Take 1 tablet by mouth at bedtime.        . promethazine (PHENERGAN) 25 MG tablet Take 25 mg by mouth every 6 (six) hours as needed. nausea         Review of Systems  Constitutional: Negative for fever.  Gastrointestinal:  Negative for abdominal pain (No Fetal Movement).    Physical Exam   Blood pressure 130/82, temperature 99.1 F (37.3 C), temperature source Oral, resp. rate 20.  Physical Exam  Constitutional: She appears well-developed and well-nourished. No distress.  HENT:  Head: Normocephalic.  Respiratory: Effort normal.  GI: Soft. She exhibits no distension. There is no tenderness.  Genitourinary:       EFM:  FHR 130s with average variability and 10 beat accels (multiple) with no decels. No contractions, but occasional irritability  Frequent fetal movement is audible on monitor   MAU Course  Procedures   Assessment and Plan  IUP at 29 weeks with perceived decrease in fetal movement Reassuring FHR tracing with audible fetal movement Per Dr Senaida Ores, d/c home Follow up  In office as scheduled  [facility] 09/26/2010, 2:50 AM

## 2010-09-26 NOTE — Progress Notes (Signed)
Pt states she called her Private MD because she had not felt the baby move. Pt states she has since then started to feel dizzy

## 2010-10-16 ENCOUNTER — Ambulatory Visit (HOSPITAL_COMMUNITY)
Admission: RE | Admit: 2010-10-16 | Discharge: 2010-10-16 | Disposition: A | Discharge status: Home / Self Care | Payer: Medicaid Other | Source: Ambulatory Visit | Attending: Obstetrics and Gynecology | Admitting: Obstetrics and Gynecology

## 2010-10-16 VITALS — BP 138/91 | HR 95 | Wt 290.0 lb

## 2010-10-16 DIAGNOSIS — O99210 Obesity complicating pregnancy, unspecified trimester: Secondary | ICD-10-CM | POA: Insufficient documentation

## 2010-10-16 DIAGNOSIS — Z3689 Encounter for other specified antenatal screening: Secondary | ICD-10-CM | POA: Insufficient documentation

## 2010-10-16 DIAGNOSIS — E669 Obesity, unspecified: Secondary | ICD-10-CM

## 2010-10-17 ENCOUNTER — Encounter (HOSPITAL_COMMUNITY): Payer: Self-pay

## 2010-10-17 ENCOUNTER — Inpatient Hospital Stay (HOSPITAL_COMMUNITY)
Admission: AD | Admit: 2010-10-17 | Discharge: 2010-10-17 | Disposition: A | Discharge status: Home / Self Care | Payer: Medicaid Other | Source: Ambulatory Visit | Attending: Obstetrics and Gynecology | Admitting: Obstetrics and Gynecology

## 2010-10-17 DIAGNOSIS — E079 Disorder of thyroid, unspecified: Secondary | ICD-10-CM | POA: Insufficient documentation

## 2010-10-17 DIAGNOSIS — Z733 Stress, not elsewhere classified: Secondary | ICD-10-CM

## 2010-10-17 DIAGNOSIS — F913 Oppositional defiant disorder: Secondary | ICD-10-CM | POA: Insufficient documentation

## 2010-10-17 DIAGNOSIS — F319 Bipolar disorder, unspecified: Secondary | ICD-10-CM | POA: Insufficient documentation

## 2010-10-17 DIAGNOSIS — F429 Obsessive-compulsive disorder, unspecified: Secondary | ICD-10-CM | POA: Insufficient documentation

## 2010-10-17 DIAGNOSIS — O98519 Other viral diseases complicating pregnancy, unspecified trimester: Secondary | ICD-10-CM | POA: Insufficient documentation

## 2010-10-17 DIAGNOSIS — O99340 Other mental disorders complicating pregnancy, unspecified trimester: Secondary | ICD-10-CM | POA: Insufficient documentation

## 2010-10-17 DIAGNOSIS — F419 Anxiety disorder, unspecified: Secondary | ICD-10-CM

## 2010-10-17 DIAGNOSIS — B009 Herpesviral infection, unspecified: Secondary | ICD-10-CM | POA: Insufficient documentation

## 2010-10-17 DIAGNOSIS — E039 Hypothyroidism, unspecified: Secondary | ICD-10-CM | POA: Insufficient documentation

## 2010-10-17 DIAGNOSIS — O99891 Other specified diseases and conditions complicating pregnancy: Secondary | ICD-10-CM | POA: Insufficient documentation

## 2010-10-17 DIAGNOSIS — R42 Dizziness and giddiness: Secondary | ICD-10-CM

## 2010-10-17 DIAGNOSIS — F411 Generalized anxiety disorder: Secondary | ICD-10-CM

## 2010-10-17 DIAGNOSIS — G4733 Obstructive sleep apnea (adult) (pediatric): Secondary | ICD-10-CM | POA: Insufficient documentation

## 2010-10-17 DIAGNOSIS — E669 Obesity, unspecified: Secondary | ICD-10-CM | POA: Insufficient documentation

## 2010-10-17 DIAGNOSIS — O169 Unspecified maternal hypertension, unspecified trimester: Secondary | ICD-10-CM | POA: Insufficient documentation

## 2010-10-17 DIAGNOSIS — O99280 Endocrine, nutritional and metabolic diseases complicating pregnancy, unspecified trimester: Secondary | ICD-10-CM | POA: Insufficient documentation

## 2010-10-17 DIAGNOSIS — F439 Reaction to severe stress, unspecified: Secondary | ICD-10-CM

## 2010-10-17 HISTORY — DX: Gestational (pregnancy-induced) hypertension without significant proteinuria, unspecified trimester: O13.9

## 2010-10-17 HISTORY — DX: Herpesviral infection, unspecified: B00.9

## 2010-10-17 LAB — URINALYSIS, ROUTINE W REFLEX MICROSCOPIC
Hgb urine dipstick: NEGATIVE
Leukocytes, UA: NEGATIVE
Nitrite: NEGATIVE
Protein, ur: NEGATIVE mg/dL
Specific Gravity, Urine: 1.02 (ref 1.005–1.030)
Urobilinogen, UA: 0.2 mg/dL (ref 0.0–1.0)

## 2010-10-17 LAB — CBC
MCV: 89.9 fL (ref 78.0–100.0)
Platelets: 294 10*3/uL (ref 150–400)
RDW: 13.9 % (ref 11.5–15.5)
WBC: 8.4 10*3/uL (ref 4.0–10.5)

## 2010-10-17 LAB — BASIC METABOLIC PANEL
Chloride: 105 mEq/L (ref 96–112)
Creatinine, Ser: 0.54 mg/dL (ref 0.50–1.10)
GFR calc Af Amer: >60 mL/min (ref >60)
Sodium: 135 mEq/L (ref 135–145)

## 2010-10-17 MED ORDER — LACTATED RINGERS IV BOLUS (SEPSIS)
1000.0000 mL | Freq: Once | INTRAVENOUS | Status: AC
  Administered 2010-10-17: 1000 mL via INTRAVENOUS

## 2010-10-17 MED ORDER — METOCLOPRAMIDE HCL 10 MG PO TABS: 10.0000 mg | ORAL_TABLET | Freq: Two times a day (BID) | ORAL | Status: AC

## 2010-10-17 MED ORDER — METOCLOPRAMIDE HCL 5 MG/ML IJ SOLN
10.0000 mg | Freq: Once | INTRAMUSCULAR | Status: AC
  Administered 2010-10-17: 10 mg via INTRAVENOUS
  Filled 2010-10-17: qty 2
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
## 2010-10-17 NOTE — Progress Notes (Signed)
Pt states hasn't "felt right" today. Complains of blurry vision & feeling "faint" since this morning. Denies leaking of fluid or vaginal bleeding. States has been feeling some contractions but unsure if they are contractions or baby "balling up". Denies any pain at this time.

## 2010-11-13 ENCOUNTER — Ambulatory Visit (HOSPITAL_COMMUNITY)
Admission: RE | Admit: 2010-11-13 | Discharge: 2010-11-13 | Disposition: A | Discharge status: Home / Self Care | Payer: Medicaid Other | Source: Ambulatory Visit | Attending: Obstetrics and Gynecology | Admitting: Obstetrics and Gynecology

## 2010-11-13 DIAGNOSIS — Z3689 Encounter for other specified antenatal screening: Secondary | ICD-10-CM | POA: Insufficient documentation

## 2010-11-13 DIAGNOSIS — E669 Obesity, unspecified: Secondary | ICD-10-CM | POA: Insufficient documentation

## 2010-11-13 DIAGNOSIS — O99210 Obesity complicating pregnancy, unspecified trimester: Secondary | ICD-10-CM | POA: Insufficient documentation

## 2010-11-13 NOTE — ED Notes (Signed)
Patient reports positive fetal movement and occasional contractions.  Denies any gross leaking or bleeding but does report occasional dark brown/clear discharge.

## 2010-11-18 ENCOUNTER — Encounter (HOSPITAL_COMMUNITY): Payer: Self-pay

## 2010-11-18 ENCOUNTER — Inpatient Hospital Stay (HOSPITAL_COMMUNITY)
Admission: AD | Admit: 2010-11-18 | Discharge: 2010-11-18 | Disposition: A | Discharge status: Home / Self Care | Payer: Medicaid Other | Source: Ambulatory Visit | Attending: Obstetrics and Gynecology | Admitting: Obstetrics and Gynecology

## 2010-11-18 ENCOUNTER — Inpatient Hospital Stay (HOSPITAL_COMMUNITY): Payer: Medicaid Other

## 2010-11-18 DIAGNOSIS — O479 False labor, unspecified: Secondary | ICD-10-CM | POA: Insufficient documentation

## 2010-11-18 DIAGNOSIS — O368190 Decreased fetal movements, unspecified trimester, not applicable or unspecified: Secondary | ICD-10-CM

## 2010-11-18 HISTORY — DX: Other specified bacterial agents as the cause of diseases classified elsewhere: B96.89

## 2010-11-18 HISTORY — DX: Tubulo-interstitial nephritis, not specified as acute or chronic: N12

## 2010-11-18 HISTORY — DX: Hyperprolactinemia: E22.1

## 2010-11-18 HISTORY — DX: Abscess of the breast and nipple: N61.1

## 2010-11-18 HISTORY — DX: Trichomoniasis, unspecified: A59.9

## 2010-11-18 HISTORY — DX: Acute vaginitis: N76.0

## 2010-11-18 HISTORY — DX: Urinary tract infection, site not specified: N39.0

## 2010-11-18 HISTORY — DX: Gastro-esophageal reflux disease without esophagitis: K21.9

## 2010-11-18 NOTE — Progress Notes (Signed)
Patient states that she felt wet watery discharge run down her legs twice at 1300pm. She also c/o irregular contraction. She was due to go to her doctor's today for weekly nst due decreased fetal movement. She denies any vaginal bleeding.

## 2010-11-18 NOTE — Progress Notes (Signed)
Pt states that about 1300 she had a little leaking of thick liquid down leg, not actively leaking. Pt states some irregular contractions.

## 2010-11-18 NOTE — ED Provider Notes (Signed)
History   Grace Simpson is a 25 year old G1P0 who presents today complaining of LOF. She states that she noticed a mucous discharge after using the restroom around 1:00pm today. She then felt that she was leaking fluid "down her leg" following this event. She denies the presence of a watery or bloody discharge. She has had irregular contractions throughout the day which she has been unable to time due to irregularity. The patient also endorses low back pain with her contractions. She has had issues feeling the baby move recently, which is why she was started on twice weekly NST testing at Dr. Berenda Morale office. She states that she can feel the baby move up to 15 times each day, however she does not feel movement every 2 hours or enough movement to perform regular kick counts. The patient also states that she was told the baby was in the breech position at her last OB visit, however, she has not been back yet to discuss options for the delivery. She was told to come here today instead of to her scheduled NST appointment and therefore does not have her next follow-up appointment scheduled yet.   Chief Complaint  Patient presents with  . Rupture of Membranes   HPI  OB History    Grav Para Term Preterm Abortions TAB SAB Ect Mult Living   1 0 0 0 0 0 0 0 0 0       Past Medical History  Diagnosis Date  . Hypertension   . Hypothyroidism   . Obesity, morbid (more than 100 lbs over ideal weight or BMI > 40)   . Bipolar affective   . OCD (obsessive compulsive disorder)   . Oppositional defiant disorder   . Obstructive sleep apnea   . Pregnancy induced hypertension     unsure if PIH or CHTN  . Herpes     last outbreak 4 months ago  . Hyperprolactinemia   . Pyelonephritis   . GERD (gastroesophageal reflux disease)   . Left breast abscess     not MRSA  . UTI (lower urinary tract infection)   . Trichomonas   . BV (bacterial vaginosis)   . Hemorrhoids     Past Surgical History  Procedure Date    . Tonsillectomy   . Left breast abcess   . Cholecystectomy   . Adenoidectomy     Family History  Problem Relation Age of Onset  . Adopted: Yes    History  Substance Use Topics  . Smoking status: Former Games developer  . Smokeless tobacco: Not on file  . Alcohol Use: No    Allergies:  Allergies  Allergen Reactions  . Benadryl Allergy Other (See Comments)    Adverse effect  . Corticosteroids Other (See Comments)    Adverse effect    Prescriptions prior to admission  Medication Sig Dispense Refill  . esomeprazole (NEXIUM) 40 MG capsule Take 40 mg by mouth daily before breakfast.        . fluticasone (FLONASE) 50 MCG/ACT nasal spray Place 2 sprays into the nose daily.        Marland Kitchen levothyroxine (SYNTHROID, LEVOTHROID) 112 MCG tablet Take 112 mcg by mouth daily.        . metoCLOPramide (REGLAN) 10 MG tablet Take 10 mg by mouth 2 (two) times daily.        . valACYclovir (VALTREX) 500 MG tablet Take 500 mg by mouth.        Marland Kitchen acetaminophen (TYLENOL) 500 MG tablet Take 1,000  mg by mouth every 6 (six) hours as needed. pain       . amoxicillin (AMOXIL) 875 MG tablet Take 875 mg by mouth 2 (two) times daily.        Marland Kitchen HYDROcodone-acetaminophen (NORCO) 5-325 MG per tablet Take 1 tablet by mouth every 4 (four) hours as needed. pain       . metroNIDAZOLE (FLAGYL) 500 MG tablet Take 500 mg by mouth 2 (two) times daily. Take for 7 days       . omeprazole (PRILOSEC OTC) 20 MG tablet Take 20 mg by mouth daily.       . prenatal vitamin w/FE, FA (PRENATAL 1 + 1) 27-1 MG TABS Take 1 tablet by mouth at bedtime.        . promethazine (PHENERGAN) 25 MG tablet Take 25 mg by mouth every 6 (six) hours as needed. nausea         Review of Systems  All other systems reviewed and are negative.   Physical Exam   Blood pressure 141/92, pulse 96, temperature 99.6 F (37.6 C), temperature source Oral, resp. rate 20, height 5\' 1"  (1.549 m), weight 134.628 kg (296 lb 12.8 oz), SpO2 99.00%.  Physical Exam   Constitutional: She is oriented to person, place, and time. She appears well-developed and well-nourished. No distress.  HENT:  Head: Normocephalic and atraumatic.  Eyes: Pupils are equal, round, and reactive to light.  Neck: Normal range of motion.  Cardiovascular: Normal rate, regular rhythm and normal heart sounds.  Exam reveals no gallop and no friction rub.   No murmur heard. Respiratory: Breath sounds normal. She has no wheezes. She has no rales.  GI: Soft. Bowel sounds are normal. She exhibits no distension and no mass. There is no tenderness. There is no guarding.  Genitourinary: Vagina normal and uterus normal. There is no rash, tenderness or lesion on the right labia. There is no rash, tenderness or lesion on the left labia. Cervix exhibits discharge (thin, white discharge). Cervix exhibits no motion tenderness and no friability.  Musculoskeletal: Normal range of motion.  Neurological: She is alert and oriented to person, place, and time.  Skin: Skin is warm. No rash noted. No erythema.  Psychiatric: She has a normal mood and affect. Her behavior is normal.    MAU Course  Procedures  Assessment and Plan  Assessment: 1. Normal pregnancy with irregular contractions  Plan: 1. Discharge home 2. Fetal monitoring today is reassuring;BPP 6/10 3. Patient to follow-up with Dr. Berenda Morale office for NST and follow-up BPP tomorrow 4. Return here as needed or if you feel regular contractions, notice bleeding or a loss of fluid   Judith Blonder 11/18/2010, 3:42 PM    I was present for the PA student's exam and agree with the above plan.  I also spoke with Dr. Ambrose Mantle regarding this pt and he advised that pt should return to office in the am for NST and evaluation.  Clinton Gallant. Rice III, DrHSc, MPAS, PA-C   Henrietta Hoover, PA 11/18/10 [phone]

## 2010-11-20 ENCOUNTER — Encounter (HOSPITAL_COMMUNITY): Payer: Self-pay | Admitting: Anesthesiology

## 2010-11-20 ENCOUNTER — Inpatient Hospital Stay (HOSPITAL_COMMUNITY)
Admission: AD | Admit: 2010-11-20 | Discharge: 2010-11-23 | DRG: 766 | Disposition: A | Discharge status: Home / Self Care | Payer: Medicaid Other | Source: Ambulatory Visit | Attending: Obstetrics and Gynecology | Admitting: Obstetrics and Gynecology

## 2010-11-20 ENCOUNTER — Encounter (HOSPITAL_COMMUNITY): Payer: Self-pay | Admitting: *Deleted

## 2010-11-20 ENCOUNTER — Encounter (HOSPITAL_COMMUNITY)
Admission: AD | Disposition: A | Discharge status: Home / Self Care | Payer: Self-pay | Source: Ambulatory Visit | Attending: Obstetrics and Gynecology

## 2010-11-20 ENCOUNTER — Inpatient Hospital Stay (HOSPITAL_COMMUNITY): Payer: Medicaid Other | Admitting: Anesthesiology

## 2010-11-20 DIAGNOSIS — O321XX0 Maternal care for breech presentation, not applicable or unspecified: Principal | ICD-10-CM | POA: Diagnosis present

## 2010-11-20 DIAGNOSIS — E079 Disorder of thyroid, unspecified: Secondary | ICD-10-CM | POA: Diagnosis present

## 2010-11-20 DIAGNOSIS — E039 Hypothyroidism, unspecified: Secondary | ICD-10-CM | POA: Diagnosis present

## 2010-11-20 DIAGNOSIS — Z22330 Carrier of Group B streptococcus: Secondary | ICD-10-CM

## 2010-11-20 DIAGNOSIS — E669 Obesity, unspecified: Secondary | ICD-10-CM | POA: Diagnosis present

## 2010-11-20 DIAGNOSIS — O99892 Other specified diseases and conditions complicating childbirth: Secondary | ICD-10-CM | POA: Diagnosis present

## 2010-11-20 HISTORY — DX: Anxiety disorder, unspecified: F41.9

## 2010-11-20 LAB — COMPREHENSIVE METABOLIC PANEL
AST: 13 U/L (ref 0–37)
Albumin: 2.6 g/dL — ABNORMAL LOW (ref 3.5–5.2)
Alkaline Phosphatase: 118 U/L — ABNORMAL HIGH (ref 39–117)
BUN: 13 mg/dL (ref 6–23)
Chloride: 103 mEq/L (ref 96–112)
Potassium: 4.3 mEq/L (ref 3.5–5.1)
Total Bilirubin: 0.2 mg/dL — ABNORMAL LOW (ref 0.3–1.2)

## 2010-11-20 LAB — CBC
HCT: 37 % (ref 36.0–46.0)
Platelets: 278 10*3/uL (ref 150–400)
RBC: 4.13 MIL/uL (ref 3.87–5.11)
RDW: 14 % (ref 11.5–15.5)
WBC: 9.2 10*3/uL (ref 4.0–10.5)

## 2010-11-20 LAB — ABO/RH: ABO/RH(D): B NEG

## 2010-11-20 SURGERY — Surgical Case
Anesthesia: Regional

## 2010-11-20 MED ORDER — MORPHINE SULFATE (PF) 0.5 MG/ML IJ SOLN
INTRAMUSCULAR | Status: DC | PRN
  Administered 2010-11-20: 4.9 mg via INTRAVENOUS
  Administered 2010-11-20: .1 mg via INTRATHECAL

## 2010-11-20 MED ORDER — OXYTOCIN 20 UNITS IN LACTATED RINGERS INFUSION - SIMPLE
INTRAVENOUS | Status: AC
  Administered 2010-11-20: 20 [IU]
  Filled 2010-11-20: qty 1000

## 2010-11-20 MED ORDER — FENTANYL CITRATE 0.05 MG/ML IJ SOLN
INTRAMUSCULAR | Status: DC | PRN
  Administered 2010-11-20: 15 ug via INTRATHECAL

## 2010-11-20 MED ORDER — MEPERIDINE HCL 25 MG/ML IJ SOLN
6.2500 mg | INTRAMUSCULAR | Status: DC | PRN
  Administered 2010-11-20: 12.5 mg via INTRAVENOUS

## 2010-11-20 MED ORDER — OXYTOCIN 20 UNITS IN LACTATED RINGERS INFUSION - SIMPLE
INTRAVENOUS | Status: DC | PRN
  Administered 2010-11-20: 20 [IU] via INTRAVENOUS

## 2010-11-20 MED ORDER — LEVOTHYROXINE SODIUM 112 MCG PO TABS
112.0000 ug | ORAL_TABLET | Freq: Every day | ORAL | Status: DC
  Administered 2010-11-21 – 2010-11-23 (×3): 112 ug via ORAL
  Filled 2010-11-20 (×6): qty 1

## 2010-11-20 MED ORDER — MEPERIDINE HCL 25 MG/ML IJ SOLN
INTRAMUSCULAR | Status: AC
  Administered 2010-11-20: 12.5 mg via INTRAVENOUS
  Filled 2010-11-20: qty 1

## 2010-11-20 MED ORDER — FENTANYL CITRATE 0.05 MG/ML IJ SOLN
INTRAMUSCULAR | Status: DC | PRN
  Administered 2010-11-20: 85 ug via INTRAVENOUS

## 2010-11-20 MED ORDER — ONDANSETRON HCL 4 MG/2ML IJ SOLN
INTRAMUSCULAR | Status: DC | PRN
  Administered 2010-11-20: 4 mg via INTRAVENOUS

## 2010-11-20 MED ORDER — PANTOPRAZOLE SODIUM 40 MG PO TBEC
40.0000 mg | DELAYED_RELEASE_TABLET | Freq: Every day | ORAL | Status: DC
  Administered 2010-11-21 – 2010-11-22 (×2): 40 mg via ORAL
  Filled 2010-11-20 (×6): qty 1

## 2010-11-20 MED ORDER — LACTATED RINGERS IV SOLN
INTRAVENOUS | Status: DC
  Administered 2010-11-20 (×3): via INTRAVENOUS

## 2010-11-20 MED ORDER — KETOROLAC TROMETHAMINE 60 MG/2ML IM SOLN
INTRAMUSCULAR | Status: AC
  Administered 2010-11-20: 60 mg via INTRAMUSCULAR
  Filled 2010-11-20: qty 2

## 2010-11-20 MED ORDER — CITRIC ACID-SODIUM CITRATE 334-500 MG/5ML PO SOLN
30.0000 mL | Freq: Once | ORAL | Status: AC
  Administered 2010-11-20: 30 mL via ORAL
  Filled 2010-11-20: qty 15

## 2010-11-20 MED ORDER — FAMOTIDINE IN NACL 20-0.9 MG/50ML-% IV SOLN
20.0000 mg | Freq: Once | INTRAVENOUS | Status: AC
  Administered 2010-11-20: 20 mg via INTRAVENOUS
  Filled 2010-11-20: qty 50

## 2010-11-20 MED ORDER — FLUTICASONE PROPIONATE 50 MCG/ACT NA SUSP
2.0000 | Freq: Every day | NASAL | Status: DC
  Administered 2010-11-21 – 2010-11-22 (×2): 2 via NASAL
  Filled 2010-11-20: qty 2

## 2010-11-20 MED ORDER — KETOROLAC TROMETHAMINE 60 MG/2ML IM SOLN
60.0000 mg | Freq: Once | INTRAMUSCULAR | Status: AC | PRN
  Administered 2010-11-20: 60 mg via INTRAMUSCULAR

## 2010-11-20 MED ORDER — FLUTICASONE PROPIONATE 50 MCG/ACT NA SUSP
2.0000 | Freq: Every day | NASAL | Status: DC
  Filled 2010-11-20: qty 320000
  Filled 2010-11-20: qty 2
  Filled 2010-11-20 (×2): qty 320000
  Filled 2010-11-20: qty 2
  Filled 2010-11-20: qty 320000
  Filled 2010-11-20: qty 2

## 2010-11-20 MED ORDER — BUPIVACAINE IN DEXTROSE 0.75-8.25 % IT SOLN
INTRATHECAL | Status: DC | PRN
  Administered 2010-11-20: 10.5 mg via INTRATHECAL

## 2010-11-20 MED ORDER — BUTORPHANOL TARTRATE 2 MG/ML IJ SOLN
1.0000 mg | Freq: Once | INTRAMUSCULAR | Status: AC
  Administered 2010-11-20: 1 mg via INTRAVENOUS
  Filled 2010-11-20: qty 1

## 2010-11-20 MED ORDER — DEXTROSE 5 % IV SOLN
2.0000 g | INTRAVENOUS | Status: AC
  Administered 2010-11-20: 2 g via INTRAVENOUS
  Filled 2010-11-20: qty 2

## 2010-11-20 MED ORDER — CITRIC ACID-SODIUM CITRATE 334-500 MG/5ML PO SOLN: 30.0000 mL | Freq: Once | ORAL | Status: DC

## 2010-11-20 MED ORDER — FAMOTIDINE IN NACL 20-0.9 MG/50ML-% IV SOLN: 20.0000 mg | Freq: Once | INTRAVENOUS | Status: DC

## 2010-11-20 SURGICAL SUPPLY — 32 items
CHLORAPREP W/TINT 26ML (MISCELLANEOUS) ×2 IMPLANT
CLOTH BEACON ORANGE TIMEOUT ST (SAFETY) ×2 IMPLANT
CONTAINER PREFILL 10% NBF 15ML (MISCELLANEOUS) IMPLANT
DRSG COVADERM 4X10 (GAUZE/BANDAGES/DRESSINGS) ×1 IMPLANT
ELECT REM PT RETURN 9FT ADLT (ELECTROSURGICAL) ×2
ELECTRODE REM PT RTRN 9FT ADLT (ELECTROSURGICAL) ×1 IMPLANT
EXTRACTOR VACUUM KIWI (MISCELLANEOUS) IMPLANT
EXTRACTOR VACUUM M CUP 4 TUBE (SUCTIONS) IMPLANT
GLOVE BIO SURGEON STRL SZ 6.5 (GLOVE) ×4 IMPLANT
GLOVE BIO SURGEON STRL SZ8 (GLOVE) ×2 IMPLANT
GLOVE SS BIOGEL STRL SZ 7.5 (GLOVE) IMPLANT
GLOVE SUPERSENSE BIOGEL SZ 7.5 (GLOVE) ×2
GOWN PREVENTION PLUS LG XLONG (DISPOSABLE) ×4 IMPLANT
KIT ABG SYR 3ML LUER SLIP (SYRINGE) IMPLANT
NDL HYPO 25X5/8 SAFETYGLIDE (NEEDLE) ×1 IMPLANT
NEEDLE HYPO 25X5/8 SAFETYGLIDE (NEEDLE) ×2 IMPLANT
NS IRRIG 1000ML POUR BTL (IV SOLUTION) ×2 IMPLANT
PACK C SECTION WH (CUSTOM PROCEDURE TRAY) ×2 IMPLANT
RTRCTR C-SECT PINK 25CM LRG (MISCELLANEOUS) ×2 IMPLANT
SLEEVE SCD COMPRESS KNEE MED (MISCELLANEOUS) IMPLANT
STAPLER VISISTAT 35W (STAPLE) IMPLANT
SUT CHROMIC 1 CTX 36 (SUTURE) ×4 IMPLANT
SUT PLAIN 0 NONE (SUTURE) IMPLANT
SUT PLAIN 2 0 XLH (SUTURE) IMPLANT
SUT VIC AB 0 CT1 27 (SUTURE) ×4
SUT VIC AB 0 CT1 27XBRD ANBCTR (SUTURE) ×2 IMPLANT
SUT VIC AB 2-0 CT1 27 (SUTURE)
SUT VIC AB 2-0 CT1 TAPERPNT 27 (SUTURE) IMPLANT
SUT VIC AB 4-0 KS 27 (SUTURE) IMPLANT
TOWEL OR 17X24 6PK STRL BLUE (TOWEL DISPOSABLE) ×4 IMPLANT
TRAY FOLEY CATH 14FR (SET/KITS/TRAYS/PACK) ×2 IMPLANT
WATER STERILE IRR 1000ML POUR (IV SOLUTION) ×2 IMPLANT

## 2010-11-20 NOTE — H&P (Addendum)
Grace Simpson is a 25 y.o. female G1P0 at 66 3/7 weeks (EDD 12/07/10 by 6 week [place]) presented to office with c/o SROM.  Grossly ruptured ini office with fern and nitrazine positive.  Bedside US, confirmed still breech.  Cervix 1-2 cm and pt with irregular ctx.  Prenatal care eventful for pt having h/o bipolar disorder and anxiety.  Had many visits throughout pregnancy with various medical complaints.  She is morbidly obese, thus when found to be breech, version was not really an option.  She has been on nausea and reflux medicines throughout pregnancy.  She is seen by Grace Simpson for lithium-induced hypothyroidism, levels are stable on her levothyroxine.  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  Pt is also GBS positive, and initially reported a PCN allergy, but since has said she can take these meds.    Maternal Medical History:  Reason for admission: Reason for admission: rupture of membranes.  Contractions: Onset was 1-2 hours ago.   Frequency: irregular.    Fetal activity: Perceived fetal activity is decreased.      OB History    Grav Para Term Preterm Abortions TAB SAB Ect Mult Living   1 0 0 0 0 0 0 0 0 0      Past Medical History  Diagnosis Date  . Hypertension   . Hypothyroidism   . Obesity, morbid (more than 100 lbs over ideal weight or BMI > 40)   . Bipolar affective   . OCD (obsessive compulsive disorder)   . Oppositional defiant disorder   . Obstructive sleep apnea   . Pregnancy induced hypertension     unsure if PIH or CHTN  . Herpes     last outbreak 4 months ago  . Hyperprolactinemia   . Pyelonephritis   . GERD (gastroesophageal reflux disease)   . Left breast abscess     not MRSA  . UTI (lower urinary tract infection)   . Trichomonas   . BV (bacterial vaginosis)   . Hemorrhoids    Past Surgical History  Procedure Date  . Tonsillectomy   . Left breast abcess   . Cholecystectomy   . Adenoidectomy    Family History: family  history is not on file.  She is adopted. Social History:  reports that she has quit smoking. She does not have any smokeless tobacco history on file. She reports that she does not drink alcohol or use illicit drugs.  ROS negative  Maternal Exam:  Uterine Assessment: Contraction strength is mild.  Contraction frequency is irregular.   Abdomen: Patient reports no abdominal tenderness. Fetal presentation: breech  Introitus: Normal vulva. Normal vagina.    Physical Exam  Constitutional: She appears well-developed.  Cardiovascular: Normal rate and regular rhythm.   Respiratory: Effort normal and breath sounds normal.  GI: Soft.  Genitourinary: Vagina normal and uterus normal.       50/1-2/-2 breech  Neurological: She is alert.  Psychiatric: She has a normal mood and affect. Her behavior is normal.    Prenatal labs: ABO, Rh: --/--/B NEG (02/29 0101) Antibody: NEG (02/28 2339) Rubella:  Equivocal Hgb AA RPR:   NR HBsAg:   Neg HIV:   Neg GBS:   Positive First trimester screen and AFP WNL One hour glucola 144 Three hour glucola 84/142/124/104 GC neg Chlam neg  Assessment/Plan: Pt is going to be monitored in MAU.  Last ate at 1130am so unless goes into labor will proceed with  c-section when anesthesia clears (probably 530pm)  Will give prophylactic antibiotics now given +GBS status. Grace Simpson 11/20/2010, 1:33 PM    D/w Grace Simpson and he wants pt to have a full 8 hours before c-section.  Will plan at 730pm.     It is now 1800pm, CTSP for painful ctx and she is tearful.  Cervix still 1cm with no change.  D/w Grace Simpson and he agrees to Stadol 1mg  until ready for OR.  FHR is not reactive, but rarely is over last several weeks when pt has not eaten.  Occasional mild variable decels, moderate variability.

## 2010-11-20 NOTE — Anesthesia Postprocedure Evaluation (Signed)
Anesthesia Post Note  Patient: Grace Simpson  Procedure(s) Performed:  CESAREAN SECTION  Anesthesia type: Spinal  Patient location: PACU  Post pain: Pain level controlled  Post assessment: Post-op Vital signs reviewed  Last Vitals:  Filed Vitals:   11/20/10 2206  BP:   Pulse:   Temp: 98 F (36.7 C)  Resp:     Post vital signs: Reviewed  Level of consciousness: awake  Complications: No apparent anesthesia complications

## 2010-11-20 NOTE — ED Notes (Signed)
Dr Senaida Ores at bedside to discus plana of care

## 2010-11-20 NOTE — Anesthesia Preprocedure Evaluation (Signed)
Anesthesia Evaluation  Name, MR# and DOB Patient awake  General Assessment Comment  Reviewed: Allergy & Precautions, H&P , NPO status , Patient's Chart, lab work & pertinent test results, reviewed documented beta blocker date and time   History of Anesthesia Complications Negative for: history of anesthetic complications  Airway Mallampati: III TM Distance: >3 FB Neck ROM: full    Dental  (+) Teeth Intact   Pulmonary sleep apnea (previous history - has discontinued CPAP) former smoker clear to auscultation        Cardiovascular hypertension (being watched by OB - no meds), regular Normal    Neuro/Psych PSYCHIATRIC DISORDERS (OCD, bipolar, ODD, anxiety) Negative Neurological ROS     GI/Hepatic Neg liver ROS  GERD Medicated  Endo/Other  Hypothyroidism Morbid obesity  Renal/GU negative Renal ROS  Female GU complaint (history of UTIs, Pyelo, multiple STDs)     Musculoskeletal   Abdominal   Peds  Hematology negative hematology ROS (+)   Anesthesia Other Findings ALLERGY TO BENADRYL AND STEROIDS - reports "jittery and tics".  Advised may need to give in the event of anaphylactic event.  Reproductive/Obstetrics (+) Pregnancy (breech in labor)                           Anesthesia Physical Anesthesia Plan  ASA: III and Emergent  Anesthesia Plan: Spinal   Post-op Pain Management:    Induction:   Airway Management Planned:   Additional Equipment:   Intra-op Plan:   Post-operative Plan:   Informed Consent: I have reviewed the patients History and Physical, chart, labs and discussed the procedure including the risks, benefits and alternatives for the proposed anesthesia with the patient or authorized representative who has indicated his/her understanding and acceptance.   Dental Advisory Given  Plan Discussed with: CRNA and Surgeon  Anesthesia Plan Comments:         Anesthesia Quick  Evaluation

## 2010-11-20 NOTE — Op Note (Signed)
Operative note  Preoperative diagnosis Term pregnancy at 37+ weeks delivered Breech presentation Spontaneous rupture of membranes  Postoperative diagnosis Same  Procedure Primary low transverse cesarean section with double layer closure of uterus  Surgeon Dr. Huel Cote Assistant Dr. Natale Milch  Anesthesia Spinal  Findings A viable female infant in the frank breech presentation Apgars were 3 and 9 weight 5 lbs. 8 oz..  a normal uterus ovaries and tubes were noted.  Fluids Estimated blood loss 800 cc Urine output 150 cc clear urine IV fluids 2200 cc LR  Specimen Placenta Sent to L&D  Procedure note After informed consent was obtained from the patient she was taken to the operating room where spinal anesthesia was obtained without difficulty. An appropriate time out was performed and she was prepped and draped in the normal sterile fashion in the dorsal supine position with a leftward tilt. A Pfannenstiel skin incision was then made with the scalpel just above the symphysis pubis and carried through to the underlying layer of fascia by sharp dissection and Bovie cautery. The fascia was then nicked in the midline and the incision extended laterally with Mayo scissors. The inferior aspect of the fascia was grasped with Coker clamps and dissected off the underlying rectus muscles. In a similar fashion the superior aspect of the fashion was dissected off the rectus muscles. These were separated in the midline and the peritoneal cavity entered sharply. The peritoneal incision was then extended both superiorly and inferiorly with careful attention to avoid both bowel bladder. The Alexis self-retaining retractor was then placed within the incision and the lower uterine segment exposed. The lower uterine segment was then incised in a transverse fashion and the cavity itself entered bluntly. Light meconium-stained fluid was noted and the infant's bottom was then delivered with the arms  reduced over the chest finally the head was delivered in a flexed position. The cord was clamped and cut and infant was handed to the waiting pediatricians with Dr. Eric Form in attendance. The baby was initially somewhat floppy but responded well to stimulation and Apgars were 3 and 9. The placenta was then delivered spontaneously and the uterus cleared of all clots and debris with a moist lap sponge. The uterine incision was then closed in 2 layers the first a running locked layer of 1-0 chromic and the second an imbricating layer of the same suture. Good hemostasis was noted. The ovaries and tubes were inspected and found to be normal. The Alexis retractor was then removed from the abdomen and all instruments and sponges removed as well. The peritoneum was then reapproximated with several interrupted mattress sutures of 0 Vicryl the fascia was then closed with 0 Vicryl in a running fashion. The subcutaneous tissue was reapproximated with 3-0 plain in a running fashion. The skin was closed with staples. Again all instrument and sponge counts were correct and the patient was taken to the recovery room in good condition.

## 2010-11-20 NOTE — Anesthesia Procedure Notes (Signed)
Spinal Block  Patient location during procedure: OR Start time: 11/20/2010 8:00 PM Staffing Performed by: anesthesiologist  Preanesthetic Checklist Completed: patient identified, site marked, surgical consent, pre-op evaluation, timeout performed, IV checked, risks and benefits discussed and monitors and equipment checked Spinal Block Patient position: sitting Prep: site prepped and draped and DuraPrep Patient monitoring: heart rate, cardiac monitor, continuous pulse ox and blood pressure Approach: midline Location: L3-4 Injection technique: single-shot Needle Needle type: Sprotte and Pencan  Needle gauge: 24 G Needle length: 9 cm Assessment Sensory level: T4 Additional Notes Unsuccessful on 1st pass with sprotte (needle length inadequate).  Easy placement with pencan.  Jasmine December, MD

## 2010-11-20 NOTE — Transfer of Care (Signed)
Immediate Anesthesia Transfer of Care Note  Patient: Grace Simpson  Procedure(s) Performed:  CESAREAN SECTION  Patient Location: PACU  Anesthesia Type: Spinal  Level of Consciousness: awake, alert , oriented and patient cooperative  Airway & Oxygen Therapy: Patient Spontanous Breathing  Post-op Assessment: Report given to PACU RN and Post -op Vital signs reviewed and stable  Post vital signs: Reviewed and stable  Complications: No apparent anesthesia complications

## 2010-11-20 NOTE — ED Notes (Signed)
Prep / clip to low abdomen done

## 2010-11-20 NOTE — Brief Op Note (Signed)
11/20/2010  8:49 PM  PATIENT:  Grace Simpson  25 y.o. female  PRE-OPERATIVE DIAGNOSIS:  Breech, Ruptured  POST-OPERATIVE DIAGNOSIS:  Breech, Ruptured  PROCEDURE:  Procedure(s): PRIMARY LOW TRANSVERSE CESAREAN SECTION  SURGEON:  Surgeon(s): Oliver Pila    ASSISTANTS: Dr Natale Milch  ANESTHESIA:   spinal  OR FLUID I/O:  Total I/O In: 1000 [I.V.:1000] Out: 850 [Urine:150; Blood:700]  BLOOD ADMINISTERED:none  Findings Viable female infant  Apgars 3,9 weight pending  Nml uterus tubes and ovaries  SPECIMEN:  placenta  DISPOSITION OF SPECIMEN:  L&D  COUNTS:  YES  TOURNIQUET:  * No tourniquets in log *  DICTATION: .Dragon Dictation  PLAN OF CARE: Admit to inpatient   PATIENT DISPOSITION:  PACU - hemodynamically stable.

## 2010-11-20 NOTE — Progress Notes (Signed)
Pt states, " My water broke at 1100. It was clear at first, but now it is yellow.I am having irregular contractions, and I have cramping in my lower back. Dr. Senaida Ores checked me in the office, and I was dilated just a little bit."

## 2010-11-21 LAB — CBC
MCH: 30.1 pg (ref 26.0–34.0)
MCHC: 33.4 g/dL (ref 30.0–36.0)
Platelets: 228 10*3/uL (ref 150–400)
RBC: 3.69 MIL/uL — ABNORMAL LOW (ref 3.87–5.11)
RDW: 14.1 % (ref 11.5–15.5)

## 2010-11-21 MED ORDER — KETOROLAC TROMETHAMINE 30 MG/ML IJ SOLN: 30.0000 mg | Freq: Four times a day (QID) | INTRAMUSCULAR | Status: AC | PRN

## 2010-11-21 MED ORDER — IBUPROFEN 600 MG PO TABS
600.0000 mg | ORAL_TABLET | Freq: Four times a day (QID) | ORAL | Status: DC
  Administered 2010-11-21 – 2010-11-23 (×9): 600 mg via ORAL
  Filled 2010-11-21 (×9): qty 1

## 2010-11-21 MED ORDER — LACTATED RINGERS IV SOLN
INTRAVENOUS | Status: DC
  Administered 2010-11-21: 05:00:00 via INTRAVENOUS

## 2010-11-21 MED ORDER — ONDANSETRON HCL 4 MG/2ML IJ SOLN: 4.0000 mg | INTRAMUSCULAR | Status: DC | PRN

## 2010-11-21 MED ORDER — NALBUPHINE SYRINGE 5 MG/0.5 ML
5.0000 mg | INJECTION | INTRAMUSCULAR | Status: DC | PRN
  Administered 2010-11-21: 10 mg via SUBCUTANEOUS
  Filled 2010-11-21: qty 1

## 2010-11-21 MED ORDER — SODIUM CHLORIDE 0.9 % IJ SOLN
3.0000 mL | INTRAMUSCULAR | Status: DC | PRN
  Administered 2010-11-21: 3 mL via INTRAVENOUS

## 2010-11-21 MED ORDER — HYDROMORPHONE HCL 1 MG/ML IJ SOLN: 0.2500 mg | INTRAMUSCULAR | Status: DC | PRN

## 2010-11-21 MED ORDER — NALBUPHINE SYRINGE 5 MG/0.5 ML
5.0000 mg | INJECTION | INTRAMUSCULAR | Status: DC | PRN
  Filled 2010-11-21: qty 1

## 2010-11-21 MED ORDER — SENNOSIDES-DOCUSATE SODIUM 8.6-50 MG PO TABS
2.0000 | ORAL_TABLET | Freq: Every day | ORAL | Status: DC
  Administered 2010-11-21 – 2010-11-22 (×2): 2 via ORAL

## 2010-11-21 MED ORDER — IBUPROFEN 600 MG PO TABS: 600.0000 mg | ORAL_TABLET | Freq: Four times a day (QID) | ORAL | Status: DC | PRN

## 2010-11-21 MED ORDER — METOCLOPRAMIDE HCL 5 MG/ML IJ SOLN: 10.0000 mg | Freq: Three times a day (TID) | INTRAMUSCULAR | Status: DC | PRN

## 2010-11-21 MED ORDER — DIBUCAINE 1 % RE OINT: 1.0000 "application " | TOPICAL_OINTMENT | RECTAL | Status: DC | PRN

## 2010-11-21 MED ORDER — KETOROLAC TROMETHAMINE 30 MG/ML IJ SOLN
30.0000 mg | Freq: Four times a day (QID) | INTRAMUSCULAR | Status: AC | PRN
  Administered 2010-11-21: 30 mg via INTRAVENOUS
  Filled 2010-11-21: qty 1

## 2010-11-21 MED ORDER — SIMETHICONE 80 MG PO CHEW
80.0000 mg | CHEWABLE_TABLET | Freq: Three times a day (TID) | ORAL | Status: DC
  Administered 2010-11-21 – 2010-11-22 (×7): 80 mg via ORAL

## 2010-11-21 MED ORDER — ONDANSETRON HCL 4 MG/2ML IJ SOLN: 4.0000 mg | Freq: Three times a day (TID) | INTRAMUSCULAR | Status: DC | PRN

## 2010-11-21 MED ORDER — PRENATAL PLUS 27-1 MG PO TABS
1.0000 | ORAL_TABLET | Freq: Every day | ORAL | Status: DC
  Administered 2010-11-21 – 2010-11-22 (×2): 1 via ORAL
  Filled 2010-11-21 (×2): qty 1

## 2010-11-21 MED ORDER — LANOLIN HYDROUS EX OINT: 1.0000 "application " | TOPICAL_OINTMENT | CUTANEOUS | Status: DC | PRN

## 2010-11-21 MED ORDER — OXYCODONE-ACETAMINOPHEN 5-325 MG PO TABS
1.0000 | ORAL_TABLET | ORAL | Status: DC | PRN
  Administered 2010-11-21: 2 via ORAL
  Administered 2010-11-21: 1 via ORAL
  Administered 2010-11-21: 2 via ORAL
  Administered 2010-11-22: 1 via ORAL
  Filled 2010-11-21: qty 2
  Filled 2010-11-21 (×2): qty 1
  Filled 2010-11-21: qty 2

## 2010-11-21 MED ORDER — WITCH HAZEL-GLYCERIN EX PADS: 1.0000 "application " | MEDICATED_PAD | CUTANEOUS | Status: DC | PRN

## 2010-11-21 MED ORDER — ZOLPIDEM TARTRATE 5 MG PO TABS: 5.0000 mg | ORAL_TABLET | Freq: Every evening | ORAL | Status: DC | PRN

## 2010-11-21 MED ORDER — SCOPOLAMINE 1 MG/3DAYS TD PT72
1.0000 | MEDICATED_PATCH | Freq: Once | TRANSDERMAL | Status: DC
  Filled 2010-11-21: qty 1

## 2010-11-21 MED ORDER — NALOXONE HCL 0.4 MG/ML IJ SOLN
1.0000 ug/kg/h | INTRAMUSCULAR | Status: DC | PRN
  Filled 2010-11-21: qty 2.5

## 2010-11-21 MED ORDER — OXYTOCIN 20 UNITS IN LACTATED RINGERS INFUSION - SIMPLE: 125.0000 mL/h | INTRAVENOUS | Status: AC

## 2010-11-21 MED ORDER — ONDANSETRON HCL 4 MG PO TABS: 4.0000 mg | ORAL_TABLET | ORAL | Status: DC | PRN

## 2010-11-21 MED ORDER — TETANUS-DIPHTH-ACELL PERTUSSIS 5-2.5-18.5 LF-MCG/0.5 IM SUSP
0.5000 mL | Freq: Once | INTRAMUSCULAR | Status: DC
  Filled 2010-11-21: qty 0.5

## 2010-11-21 MED ORDER — NALOXONE HCL 0.4 MG/ML IJ SOLN: 0.4000 mg | INTRAMUSCULAR | Status: DC | PRN

## 2010-11-21 MED ORDER — MENTHOL 3 MG MT LOZG: 1.0000 | LOZENGE | OROMUCOSAL | Status: DC | PRN

## 2010-11-21 MED ORDER — SIMETHICONE 80 MG PO CHEW: 80.0000 mg | CHEWABLE_TABLET | ORAL | Status: DC | PRN

## 2010-11-21 NOTE — Progress Notes (Signed)
Subjective: Postpartum Day 1: Cesarean Delivery Patient reports tolerating PO clears wants regular diet, foley in place with good UOP.  Had some incisional pain this AM and now better.   Objective: Vital signs in last 24 hours: Temp:  [97.7 F (36.5 C)-98.8 F (37.1 C)] 98.1 F (36.7 C) (10/05 0500) Pulse Rate:  [74-111] 92  (10/05 0500) Resp:  [11-22] 18  (10/05 0500) BP: (131-154)/(73-91) 145/90 mmHg (10/05 0500) SpO2:  [96 %-100 %] 96 % (10/05 0500) Weight:  [133.471 kg (294 lb 4 oz)] 294 lb 4 oz (133.471 kg) (10/04 1346)  Physical Exam:  General: alert Lochia: appropriate Uterine Fundus: firm Incision: C/D/I DVT Evaluation: No evidence of DVT seen on physical exam.   Basename 11/21/10 0540 11/20/10 1422  HGB 11.1* 12.5  HCT 33.2* 37.0    Assessment/Plan: Status post Cesarean section. Doing well postoperatively.  Continue current care.  Oliver Pila 11/21/2010, 8:39 AM

## 2010-11-21 NOTE — Progress Notes (Signed)
UR chart review completed.  

## 2010-11-22 MED ORDER — HYDROMORPHONE HCL 2 MG PO TABS
4.0000 mg | ORAL_TABLET | ORAL | Status: DC | PRN
  Administered 2010-11-22 – 2010-11-23 (×3): 4 mg via ORAL
  Filled 2010-11-22: qty 2
  Filled 2010-11-22: qty 1
  Filled 2010-11-22: qty 2
  Filled 2010-11-22: qty 1

## 2010-11-22 MED ORDER — RHO D IMMUNE GLOBULIN 1500 UNIT/2ML IJ SOLN
300.0000 ug | Freq: Once | INTRAMUSCULAR | Status: AC
  Administered 2010-11-22: 300 ug via INTRAMUSCULAR
  Filled 2010-11-22: qty 2

## 2010-11-22 NOTE — Progress Notes (Addendum)
POD#2 c-section Pain medication not working well Afeb, VSS Abd- soft, ND, fundus-firm, dressing C/D/I Will try PO Dilaudid for pain, otherwise continue routine care.

## 2010-11-23 ENCOUNTER — Encounter (HOSPITAL_COMMUNITY)
Admission: RE | Admit: 2010-11-23 | Discharge: 2010-11-23 | Disposition: A | Discharge status: Home / Self Care | Payer: Medicaid Other | Source: Ambulatory Visit | Attending: Obstetrics and Gynecology | Admitting: Obstetrics and Gynecology

## 2010-11-23 ENCOUNTER — Encounter (HOSPITAL_COMMUNITY): Payer: Self-pay | Admitting: *Deleted

## 2010-11-23 DIAGNOSIS — O923 Agalactia: Secondary | ICD-10-CM | POA: Insufficient documentation

## 2010-11-23 LAB — RH IG WORKUP (INCLUDES ABO/RH): Gestational Age(Wks): 37.3

## 2010-11-23 MED ORDER — HYDROMORPHONE HCL 4 MG PO TABS: 4.0000 mg | ORAL_TABLET | ORAL | Status: AC | PRN

## 2010-11-23 MED ORDER — IBUPROFEN 600 MG PO TABS: 600.0000 mg | ORAL_TABLET | Freq: Four times a day (QID) | ORAL | Status: AC

## 2010-11-23 MED ORDER — MEASLES, MUMPS & RUBELLA VAC ~~LOC~~ INJ
0.5000 mL | INJECTION | Freq: Once | SUBCUTANEOUS | Status: AC
  Administered 2010-11-23: 0.5 mL via SUBCUTANEOUS
  Filled 2010-11-23 (×2): qty 0.5

## 2010-11-23 NOTE — Progress Notes (Signed)
POD#3 c-section Doing better, Dilaudid works better for pain, wants to go home Afeb, VSS Abd- soft, fundus firm, incision intact Discharge home

## 2010-11-23 NOTE — Discharge Summary (Signed)
Obstetric Discharge Summary Reason for Admission: rupture of membranes Prenatal Procedures: none Intrapartum Procedures: cesarean: low cervical, transverse Postpartum Procedures: none Complications-Operative and Postpartum: none Hemoglobin  Date Value Range Status  11/21/2010 11.1* 12.0-15.0 (g/dL) Final     HCT  Date Value Range Status  11/21/2010 33.2* 36.0-46.0 (%) Final    Discharge Diagnoses: Term Pregnancy-delivered and breech  Discharge Information: Date: 11/23/2010 Activity: pelvic rest and no strenuous activity Diet: routine Medications: Ibuprofen and Dilaudid Condition: stable Instructions: refer to practice specific booklet Discharge to: home Follow-up Information    Follow up with RICHARDSON,KATHY W. Make an appointment in 1 week. (for staple removal)    Contact information:   [address] [place] [address] [phone]          Newborn Data: Live born female  Birth Weight: 5 lb 8.5 oz (2510 g) APGAR: 3, 9  Home with mother.  Grace Simpson D 11/23/2010, 10:34 AM

## 2010-11-24 ENCOUNTER — Encounter (HOSPITAL_COMMUNITY): Payer: Self-pay | Admitting: Obstetrics and Gynecology

## 2010-11-24 LAB — URINE CULTURE

## 2010-11-24 LAB — URINALYSIS, ROUTINE W REFLEX MICROSCOPIC
Nitrite: NEGATIVE
Specific Gravity, Urine: 1.015
pH: 6.5

## 2010-11-24 LAB — URINE MICROSCOPIC-ADD ON

## 2010-12-02 ENCOUNTER — Emergency Department (HOSPITAL_COMMUNITY)
Admission: EM | Admit: 2010-12-02 | Discharge: 2010-12-03 | Disposition: A | Discharge status: Home / Self Care | Payer: Medicaid Other | Attending: Emergency Medicine | Admitting: Emergency Medicine

## 2010-12-02 ENCOUNTER — Encounter (HOSPITAL_COMMUNITY): Payer: Self-pay | Admitting: *Deleted

## 2010-12-02 DIAGNOSIS — O99893 Other specified diseases and conditions complicating puerperium: Secondary | ICD-10-CM | POA: Insufficient documentation

## 2010-12-02 DIAGNOSIS — E039 Hypothyroidism, unspecified: Secondary | ICD-10-CM | POA: Insufficient documentation

## 2010-12-02 DIAGNOSIS — K5909 Other constipation: Secondary | ICD-10-CM | POA: Insufficient documentation

## 2010-12-02 DIAGNOSIS — F319 Bipolar disorder, unspecified: Secondary | ICD-10-CM | POA: Insufficient documentation

## 2010-12-02 DIAGNOSIS — F411 Generalized anxiety disorder: Secondary | ICD-10-CM | POA: Insufficient documentation

## 2010-12-02 DIAGNOSIS — O9089 Other complications of the puerperium, not elsewhere classified: Secondary | ICD-10-CM | POA: Insufficient documentation

## 2010-12-02 DIAGNOSIS — O165 Unspecified maternal hypertension, complicating the puerperium: Secondary | ICD-10-CM | POA: Insufficient documentation

## 2011-01-05 ENCOUNTER — Encounter: Payer: Self-pay | Admitting: Obstetrics and Gynecology

## 2011-01-05 LAB — US OB FOLLOW UP

## 2011-02-25 ENCOUNTER — Other Ambulatory Visit: Payer: Self-pay

## 2011-02-25 ENCOUNTER — Emergency Department (HOSPITAL_COMMUNITY)
Admission: EM | Admit: 2011-02-25 | Discharge: 2011-02-25 | Disposition: A | Discharge status: Home / Self Care | Payer: Medicaid Other | Attending: Emergency Medicine | Admitting: Emergency Medicine

## 2011-02-25 ENCOUNTER — Encounter (HOSPITAL_COMMUNITY): Payer: Self-pay | Admitting: Emergency Medicine

## 2011-02-25 DIAGNOSIS — E876 Hypokalemia: Secondary | ICD-10-CM | POA: Insufficient documentation

## 2011-02-25 DIAGNOSIS — I10 Essential (primary) hypertension: Secondary | ICD-10-CM | POA: Insufficient documentation

## 2011-02-25 DIAGNOSIS — R079 Chest pain, unspecified: Secondary | ICD-10-CM | POA: Insufficient documentation

## 2011-02-25 DIAGNOSIS — R42 Dizziness and giddiness: Secondary | ICD-10-CM | POA: Insufficient documentation

## 2011-02-25 DIAGNOSIS — E039 Hypothyroidism, unspecified: Secondary | ICD-10-CM | POA: Insufficient documentation

## 2011-02-25 LAB — URINALYSIS, ROUTINE W REFLEX MICROSCOPIC
Leukocytes, UA: NEGATIVE
Nitrite: NEGATIVE
Specific Gravity, Urine: 1.026 (ref 1.005–1.030)
Urobilinogen, UA: 0.2 mg/dL (ref 0.0–1.0)
pH: 5.5 (ref 5.0–8.0)

## 2011-02-25 LAB — BASIC METABOLIC PANEL
Chloride: 106 mEq/L (ref 96–112)
GFR calc Af Amer: >90 mL/min (ref >90)
Potassium: 3.3 mEq/L — ABNORMAL LOW (ref 3.5–5.1)
Sodium: 140 mEq/L (ref 135–145)

## 2011-02-25 LAB — CBC
Platelets: 318 10*3/uL (ref 150–400)
RDW: 14.8 % (ref 11.5–15.5)
WBC: 8.8 10*3/uL (ref 4.0–10.5)

## 2011-02-25 LAB — URINE MICROSCOPIC-ADD ON

## 2011-02-25 LAB — TSH: TSH: 1.852 u[IU]/mL (ref 0.350–4.500)

## 2011-02-25 MED ORDER — SODIUM CHLORIDE 0.9 % IV BOLUS (SEPSIS)
1000.0000 mL | Freq: Once | INTRAVENOUS | Status: AC
  Administered 2011-02-25: 1000 mL via INTRAVENOUS

## 2011-02-25 MED ORDER — POTASSIUM CHLORIDE CRYS ER 20 MEQ PO TBCR: 20.0000 meq | EXTENDED_RELEASE_TABLET | Freq: Two times a day (BID) | ORAL | Status: DC

## 2011-02-25 MED ORDER — ACETAMINOPHEN 500 MG PO TABS
1000.0000 mg | ORAL_TABLET | Freq: Once | ORAL | Status: AC
  Administered 2011-02-25: 1000 mg via ORAL
  Filled 2011-02-25: qty 2

## 2011-02-25 MED ORDER — PROMETHAZINE HCL 25 MG RE SUPP: 25.0000 mg | Freq: Four times a day (QID) | RECTAL | Status: AC | PRN

## 2011-02-25 MED ORDER — ONDANSETRON 4 MG PO TBDP: 4.0000 mg | ORAL_TABLET | Freq: Three times a day (TID) | ORAL | Status: AC | PRN

## 2011-02-25 NOTE — ED Notes (Signed)
Pt given discharge instructions and verbalizes understanding  

## 2011-02-25 NOTE — ED Notes (Signed)
Pt alert, nad, c/o dizziness, h/a, onset several months ago, presents to ED c/o Chest Pain, resp even unlabored, skin pwd, shows no s/s of distress or discomfort,

## 2011-02-25 NOTE — ED Provider Notes (Signed)
History     CSN: [id number]  Arrival date & time 02/25/11  0001   First MD Initiated Contact with Patient 02/25/11 0310      Chief Complaint  Patient presents with  . Dizziness  . Chest Pain    (Consider location/radiation/quality/duration/timing/severity/associated sxs/prior treatment) HPI Comments: 26 year old female with a history of delivering a healthy term baby approximately 2 months ago who presents with 2 complaints, chest pain and dizziness  #1 chest pain.  Since proximally 11 PM last night the patient has had intermittent sharp chest pains lasting 2-3 seconds, having 2-3 episodes per hour. Nothing makes this better or worse, it is not associated with swelling, shortness of breath or fevers. She is on birth control pills and states that she did have sharp chest pains throughout her pregnancy which resolved spontaneously and nothing was ever tested or done about it. The symptoms are mild, fleeting and she has had no medication prior to arrival.  #2 dizziness:  Patient states that she has been having intermittent dizzy spells described as lightheadedness. Associated with this is a feeling of seeing stars, feeling like she is going to pass out though she has not passed out. This has also been intermittent, mild to moderate and is not currently feeling dizzy. This may get worse when she stands up but she is also experienced a while sitting. She denies vaginal bleeding but she does endorse easy bruising. She denies dysuria, cough, shortness of breath, fever, swelling, diarrhea. She has been able to tolerate fluids though she feels like her mouth is constantly dry.  She has not been evaluated by her primary physician for either of these problems.  Patient is a 26 y.o. female presenting with chest pain. The history is provided by the patient and the spouse.  Chest Pain     Past Medical History  Diagnosis Date  . Hypertension   . Hypothyroidism   . Obesity, morbid (more than 100 lbs  over ideal weight or BMI > 40)   . Bipolar affective   . OCD (obsessive compulsive disorder)   . Oppositional defiant disorder   . Obstructive sleep apnea   . Pregnancy induced hypertension     unsure if PIH or CHTN  . Herpes     last outbreak 4 months ago  . Hyperprolactinemia   . Pyelonephritis   . GERD (gastroesophageal reflux disease)   . Left breast abscess     not MRSA  . UTI (lower urinary tract infection)   . Trichomonas   . BV (bacterial vaginosis)   . Hemorrhoids   . Anxiety     Past Surgical History  Procedure Date  . Tonsillectomy   . Left breast abcess   . Cholecystectomy   . Adenoidectomy   . Cesarean section 11/20/2010    Procedure: CESAREAN SECTION;  Surgeon: Grace Simpson;  Location: [facility];  Service: Gynecology;  Laterality: N/A;    Family History  Problem Relation Age of Onset  . Adopted: Yes    History  Substance Use Topics  . Smoking status: Former Smoker    Quit date: 03/29/2010  . Smokeless tobacco: Never Used  . Alcohol Use: No    OB History    Grav Para Term Preterm Abortions TAB SAB Ect Mult Living   2 1 1  0 0 0 0 0 0 1      Review of Systems  Cardiovascular: Positive for chest pain.  All other systems reviewed and are negative.  Allergies  Benadryl allergy and Corticosteroids  Home Medications   Current Outpatient Rx  Name Route Sig Dispense Refill  . LEVOTHYROXINE SODIUM 112 MCG PO TABS Oral Take 112 mcg by mouth daily.      . ACETAMINOPHEN 500 MG PO TABS Oral Take 1,000 mg by mouth every 6 (six) hours as needed. pain     . FLUTICASONE PROPIONATE 50 MCG/ACT NA SUSP Nasal Place 2 sprays into the nose daily.      Marland Kitchen POTASSIUM CHLORIDE CRYS ER 20 MEQ PO TBCR Oral Take 1 tablet (20 mEq total) by mouth 2 (two) times daily. 20 tablet 0    BP 129/70  Pulse 80  Temp(Src) 98.2 F (36.8 C) (Oral)  Resp 16  Wt 255 lb (115.667 kg)  SpO2 100%  Physical Exam  Nursing note and vitals reviewed. Constitutional: She appears  well-developed and well-nourished. No distress.  HENT:  Head: Normocephalic and atraumatic.  Mouth/Throat: Oropharynx is clear and moist. No oropharyngeal exudate.  Eyes: Conjunctivae and EOM are normal. Pupils are equal, round, and reactive to light. Right eye exhibits no discharge. Left eye exhibits no discharge. No scleral icterus.  Neck: Normal range of motion. Neck supple. No JVD present. No thyromegaly present.  Cardiovascular: Normal rate, regular rhythm, normal heart sounds and intact distal pulses.  Exam reveals no gallop and no friction rub.   No murmur heard. Pulmonary/Chest: Effort normal and breath sounds normal. No respiratory distress. She has no wheezes. She has no rales.  Abdominal: Soft. Bowel sounds are normal. She exhibits no distension and no mass. There is no tenderness.  Musculoskeletal: Normal range of motion. She exhibits no edema and no tenderness.  Lymphadenopathy:    She has no cervical adenopathy.  Neurological: She is alert. Coordination normal.  Skin: Skin is warm and dry. No rash noted. No erythema.  Psychiatric: She has a normal mood and affect. Her behavior is normal.    ED Course  Procedures (including critical care time)  Labs Reviewed  BASIC METABOLIC PANEL - Abnormal; Notable for the following:    Potassium 3.3 (*)    All other components within normal limits  URINALYSIS, ROUTINE W REFLEX MICROSCOPIC - Abnormal; Notable for the following:    Hgb urine dipstick LARGE (*)    All other components within normal limits  URINE MICROSCOPIC-ADD ON - Abnormal; Notable for the following:    Squamous Epithelial / LPF FEW (*)    Bacteria, UA FEW (*)    All other components within normal limits  CBC  PREGNANCY, URINE  TSH   No results found.   1. Dizziness   2. Chest pain   3. Hypokalemia       MDM  Vital signs are normal other than mild hypertension, there is no tachycardia an EKG is perfectly normal. Will evaluate further by testing urine for  infection, glucose urea, a little abnormalities or renal insufficiency. Patient appears well at this time. Tylenol given for intermittent chest pains.    ED ECG REPORT   Date: 02/25/2011   Rate: 73  Rhythm: normal sinus rhythm  QRS Axis: normal  Intervals: normal  ST/T Wave abnormalities: normal  Conduction Disutrbances:none  Narrative Interpretation:   Old EKG Reviewed: none available   Labs are unremarkable, no orthostatic changes, vital signs normal, blood work shows slight hypokalemia, no dehydration on urinalysis, nonpregnant, no anemia. Patient appears stable for discharge with adequate followup. She is expressed her understanding and will arrange followup today. Followup instructions given  and contact was given the  Vida Roller, MD 02/25/11 [phone]

## 2011-08-25 ENCOUNTER — Encounter (HOSPITAL_COMMUNITY): Payer: Self-pay | Admitting: Emergency Medicine

## 2011-08-25 ENCOUNTER — Emergency Department (HOSPITAL_COMMUNITY)
Admission: EM | Admit: 2011-08-25 | Discharge: 2011-08-25 | Disposition: A | Discharge status: Home / Self Care | Payer: Medicaid Other | Attending: Emergency Medicine | Admitting: Emergency Medicine

## 2011-08-25 DIAGNOSIS — K219 Gastro-esophageal reflux disease without esophagitis: Secondary | ICD-10-CM | POA: Insufficient documentation

## 2011-08-25 DIAGNOSIS — S0003XA Contusion of scalp, initial encounter: Secondary | ICD-10-CM | POA: Insufficient documentation

## 2011-08-25 DIAGNOSIS — E039 Hypothyroidism, unspecified: Secondary | ICD-10-CM | POA: Insufficient documentation

## 2011-08-25 DIAGNOSIS — G4733 Obstructive sleep apnea (adult) (pediatric): Secondary | ICD-10-CM | POA: Insufficient documentation

## 2011-08-25 DIAGNOSIS — F429 Obsessive-compulsive disorder, unspecified: Secondary | ICD-10-CM | POA: Insufficient documentation

## 2011-08-25 DIAGNOSIS — F411 Generalized anxiety disorder: Secondary | ICD-10-CM | POA: Insufficient documentation

## 2011-08-25 DIAGNOSIS — I10 Essential (primary) hypertension: Secondary | ICD-10-CM | POA: Insufficient documentation

## 2011-08-25 DIAGNOSIS — F319 Bipolar disorder, unspecified: Secondary | ICD-10-CM | POA: Insufficient documentation

## 2011-08-25 DIAGNOSIS — S0083XA Contusion of other part of head, initial encounter: Secondary | ICD-10-CM

## 2011-08-25 DIAGNOSIS — IMO0002 Reserved for concepts with insufficient information to code with codable children: Secondary | ICD-10-CM | POA: Insufficient documentation
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
## 2011-08-25 NOTE — ED Notes (Signed)
Pt has red area below right shoulder blade.
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
## 2011-08-25 NOTE — ED Notes (Signed)
CSI in room  

## 2011-08-25 NOTE — ED Notes (Signed)
Bed:WA13<BR> Expected date:<BR> Expected time:<BR> Means of arrival:<BR> Comments:<BR> EMS

## 2011-08-25 NOTE — ED Notes (Signed)
Pt has an abrasion under the right eye. Pt also has a hematoma on the right side of her forehead

## 2012-02-27 ENCOUNTER — Emergency Department (HOSPITAL_COMMUNITY)
Admission: EM | Admit: 2012-02-27 | Discharge: 2012-02-27 | Disposition: A | Discharge status: Home / Self Care | Payer: Medicaid Other | Attending: Emergency Medicine | Admitting: Emergency Medicine

## 2012-02-27 ENCOUNTER — Encounter (HOSPITAL_COMMUNITY): Payer: Self-pay | Admitting: Emergency Medicine

## 2012-02-27 DIAGNOSIS — Z87448 Personal history of other diseases of urinary system: Secondary | ICD-10-CM | POA: Insufficient documentation

## 2012-02-27 DIAGNOSIS — Z8742 Personal history of other diseases of the female genital tract: Secondary | ICD-10-CM | POA: Insufficient documentation

## 2012-02-27 DIAGNOSIS — Z8639 Personal history of other endocrine, nutritional and metabolic disease: Secondary | ICD-10-CM | POA: Insufficient documentation

## 2012-02-27 DIAGNOSIS — E039 Hypothyroidism, unspecified: Secondary | ICD-10-CM | POA: Insufficient documentation

## 2012-02-27 DIAGNOSIS — R42 Dizziness and giddiness: Secondary | ICD-10-CM | POA: Insufficient documentation

## 2012-02-27 DIAGNOSIS — Z8619 Personal history of other infectious and parasitic diseases: Secondary | ICD-10-CM | POA: Insufficient documentation

## 2012-02-27 DIAGNOSIS — M25519 Pain in unspecified shoulder: Secondary | ICD-10-CM | POA: Insufficient documentation

## 2012-02-27 DIAGNOSIS — R110 Nausea: Secondary | ICD-10-CM | POA: Insufficient documentation

## 2012-02-27 DIAGNOSIS — Z87891 Personal history of nicotine dependence: Secondary | ICD-10-CM | POA: Insufficient documentation

## 2012-02-27 DIAGNOSIS — Z79899 Other long term (current) drug therapy: Secondary | ICD-10-CM | POA: Insufficient documentation

## 2012-02-27 DIAGNOSIS — M549 Dorsalgia, unspecified: Secondary | ICD-10-CM | POA: Insufficient documentation

## 2012-02-27 DIAGNOSIS — Z331 Pregnant state, incidental: Secondary | ICD-10-CM | POA: Insufficient documentation

## 2012-02-27 DIAGNOSIS — G4733 Obstructive sleep apnea (adult) (pediatric): Secondary | ICD-10-CM | POA: Insufficient documentation

## 2012-02-27 DIAGNOSIS — Z87440 Personal history of urinary (tract) infections: Secondary | ICD-10-CM | POA: Insufficient documentation

## 2012-02-27 DIAGNOSIS — Z862 Personal history of diseases of the blood and blood-forming organs and certain disorders involving the immune mechanism: Secondary | ICD-10-CM | POA: Insufficient documentation

## 2012-02-27 DIAGNOSIS — I10 Essential (primary) hypertension: Secondary | ICD-10-CM | POA: Insufficient documentation

## 2012-02-27 DIAGNOSIS — Z8659 Personal history of other mental and behavioral disorders: Secondary | ICD-10-CM | POA: Insufficient documentation

## 2012-02-27 DIAGNOSIS — Z8719 Personal history of other diseases of the digestive system: Secondary | ICD-10-CM | POA: Insufficient documentation

## 2012-02-27 LAB — URINALYSIS, ROUTINE W REFLEX MICROSCOPIC
Glucose, UA: NEGATIVE mg/dL
Leukocytes, UA: NEGATIVE
Nitrite: NEGATIVE
Protein, ur: NEGATIVE mg/dL
pH: 5.5 (ref 5.0–8.0)

## 2012-02-27 LAB — POCT I-STAT, CHEM 8
BUN: 9 mg/dL (ref 6–23)
Calcium, Ion: 1.25 mmol/L — ABNORMAL HIGH (ref 1.12–1.23)
Chloride: 107 mEq/L (ref 96–112)
Creatinine, Ser: 0.8 mg/dL (ref 0.50–1.10)
Glucose, Bld: 85 mg/dL (ref 70–99)
HCT: 45 % (ref 36.0–46.0)
Hemoglobin: 15.3 g/dL — ABNORMAL HIGH (ref 12.0–15.0)
Potassium: 3.7 mEq/L (ref 3.5–5.1)
Sodium: 138 mEq/L (ref 135–145)
TCO2: 21 mmol/L (ref 0–100)

## 2012-02-27 LAB — HCG, QUANTITATIVE, PREGNANCY: hCG, Beta Chain, Quant, S: 4776 m[IU]/mL — ABNORMAL HIGH (ref <5)

## 2012-02-27 NOTE — ED Notes (Signed)
Pt states that she is [redacted] weeks pregnant and the entire time she has had lt shoulder pain, nausea, and dizziness.  Pt states "I wonder if it's ectopic".

## 2012-02-27 NOTE — ED Notes (Signed)
Discharge instructions explained and given to pt; pt verbalized understanding instructions

## 2012-02-27 NOTE — ED Provider Notes (Signed)
History     CSN: [id number]  Arrival date & time 02/27/12  1430   First MD Initiated Contact with Patient 02/27/12 1607      Chief Complaint  Patient presents with  . Shoulder Pain  . Nausea  . Dizziness    (Consider location/radiation/quality/duration/timing/severity/associated sxs/prior treatment) HPI Comments: Grace Simpson is a 27 y.o. female G2P1 ([redacted] weeks pregnant) presents to the ER stating she is concerned she has an ectopic pregnancy bc she read on "google" that her shoulder pain could be caused by this. Pt reports shoulder pain onset began approximately 2 weeks ago after a deep sleep, is located in left shoulder and neck, with radiation down to elbow & worsened by movement. Pain is unrelieved by Tylenol. Other symptoms include low back pain, morning nausea, dizziness, and intermittent abdominal cramping. Pt denies any abdominal pain, syncope, seizure, spotting, vaginal bleeding, light headedness, fever, night sweats, chills, vaginal dc, or urinary symptoms. No other complaints at this time.   The history is provided by the patient.    Past Medical History  Diagnosis Date  . Hypertension   . Hypothyroidism   . Obesity, morbid (more than 100 lbs over ideal weight or BMI > 40)   . Bipolar affective   . OCD (obsessive compulsive disorder)   . Oppositional defiant disorder   . Obstructive sleep apnea   . Pregnancy induced hypertension     unsure if PIH or CHTN  . Herpes     last outbreak 4 months ago  . Hyperprolactinemia   . Pyelonephritis   . GERD (gastroesophageal reflux disease)   . Left breast abscess     not MRSA  . UTI (lower urinary tract infection)   . Trichomonas   . BV (bacterial vaginosis)   . Hemorrhoids   . Anxiety     Past Surgical History  Procedure Date  . Tonsillectomy   . Left breast abcess   . Cholecystectomy   . Adenoidectomy   . Cesarean section 11/20/2010    Procedure: CESAREAN SECTION;  Surgeon: Oliver Pila;  Location: [facility];   Service: Gynecology;  Laterality: N/A;    Family History  Problem Relation Age of Onset  . Adopted: Yes    History  Substance Use Topics  . Smoking status: Former Smoker    Quit date: 03/29/2010  . Smokeless tobacco: Never Used  . Alcohol Use: No    OB History    Grav Para Term Preterm Abortions TAB SAB Ect Mult Living   3 1 1  0 0 0 0 0 0 1      Review of Systems  Constitutional: Negative for fever, diaphoresis and activity change.  HENT: Negative for congestion and neck pain.   Respiratory: Negative for cough.   Gastrointestinal: Positive for nausea.  Genitourinary: Negative for dysuria.  Musculoskeletal: Negative for myalgias.       Shoulder pain, left  Skin: Negative for color change and wound.  Neurological: Positive for dizziness. Negative for headaches.  All other systems reviewed and are negative.    Allergies  Corticosteroids and Diphenhydramine hcl  Home Medications   Current Outpatient Rx  Name  Route  Sig  Dispense  Refill  . ACETAMINOPHEN 500 MG PO TABS   Oral   Take 1,000 mg by mouth every 6 (six) hours as needed. pain          . LEVOTHYROXINE SODIUM 112 MCG PO TABS   Oral   Take 112 mcg by mouth every  morning.            BP 125/72  Pulse 82  Temp 99.2 F (37.3 C) (Oral)  Resp 18  SpO2 98%  LMP 01/19/2012  Physical Exam  Nursing note and vitals reviewed. Constitutional: She is oriented to person, place, and time. She appears well-developed and well-nourished. No distress.  HENT:  Head: Normocephalic and atraumatic.  Eyes: Conjunctivae normal and EOM are normal.  Neck: Normal range of motion.       ROM induces shoulder pain.   Cardiovascular:       RRR, no pitting edema, intact distal pulses  Pulmonary/Chest: Effort normal.       LCAB  Abdominal:       Obese/gravid abdomen. Non tender  Musculoskeletal: Normal range of motion.       No calf ttp. Left scapular region with palpable muscular knots that are very ttp. Pain worsened  with LUE ROM.   Neurological: She is alert and oriented to person, place, and time.       Strength 5/5 bilaterally.   Skin: Skin is warm and dry. No rash noted. She is not diaphoretic.  Psychiatric: She has a normal mood and affect. Her behavior is normal.    ED Course  Procedures (including critical care time)  Labs Reviewed  HCG, QUANTITATIVE, PREGNANCY - Abnormal; Notable for the following:    hCG, Beta Chain, Quant, S 4776 (*)     All other components within normal limits  URINALYSIS, ROUTINE W REFLEX MICROSCOPIC - Abnormal; Notable for the following:    APPearance CLOUDY (*)     All other components within normal limits  HCG, SERUM, QUALITATIVE   No results found.   No diagnosis found.    MDM  Pt is [redacted] weeks pregnant presenting to ER c/o left shoulder pain with concern that etiology of pain is d/t ectopic pregnancy. She denies abd pain or spotting. PE consistent with radiculopathy/musculoskeletal etiology. Will check HCG levels, but anticipate wnl. UA pending as well. Anticipate dc home with conservative tx for shoulder and f-u with OBGYN  Pts hcg levels WNL of 5 wk pregnancy, no UTI seen. Pt stable for dc BP 125/72  Pulse 82  Temp 99.2 F (37.3 C) (Oral)  Resp 18  SpO2 98%  LMP 01/19/2012  At this time there does not appear to be any evidence of an acute emergency medical condition and the patient appears stable for discharge with appropriate outpatient follow up.Diagnosis was discussed with patient who verbalizes understanding and is agreeable to discharge.         Jaci Carrel, [place] 02/27/12 1742

## 2012-02-28 NOTE — ED Provider Notes (Signed)
Medical screening examination/treatment/procedure(s) were performed by non-physician practitioner and as supervising physician I was immediately available for consultation/collaboration.   Suzi Roots, MD 02/28/12 [phone]

## 2012-05-13 LAB — OB RESULTS CONSOLE HIV ANTIBODY (ROUTINE TESTING): HIV: NONREACTIVE

## 2012-06-12 ENCOUNTER — Encounter (HOSPITAL_COMMUNITY): Payer: Self-pay | Admitting: *Deleted

## 2012-06-12 ENCOUNTER — Inpatient Hospital Stay (HOSPITAL_COMMUNITY)
Admission: AD | Admit: 2012-06-12 | Discharge: 2012-06-12 | Disposition: A | Discharge status: Home / Self Care | Payer: Medicaid Other | Source: Ambulatory Visit | Attending: Obstetrics and Gynecology | Admitting: Obstetrics and Gynecology

## 2012-06-12 DIAGNOSIS — E669 Obesity, unspecified: Secondary | ICD-10-CM | POA: Insufficient documentation

## 2012-06-12 DIAGNOSIS — O99210 Obesity complicating pregnancy, unspecified trimester: Secondary | ICD-10-CM | POA: Insufficient documentation

## 2012-06-12 DIAGNOSIS — Z6841 Body Mass Index (BMI) 40.0 and over, adult: Secondary | ICD-10-CM | POA: Insufficient documentation

## 2012-06-12 DIAGNOSIS — O10019 Pre-existing essential hypertension complicating pregnancy, unspecified trimester: Secondary | ICD-10-CM | POA: Insufficient documentation

## 2012-06-12 DIAGNOSIS — O99280 Endocrine, nutritional and metabolic diseases complicating pregnancy, unspecified trimester: Secondary | ICD-10-CM | POA: Insufficient documentation

## 2012-06-12 DIAGNOSIS — E039 Hypothyroidism, unspecified: Secondary | ICD-10-CM | POA: Insufficient documentation

## 2012-06-12 DIAGNOSIS — O10012 Pre-existing essential hypertension complicating pregnancy, second trimester: Secondary | ICD-10-CM

## 2012-06-12 DIAGNOSIS — O1002 Pre-existing essential hypertension complicating childbirth: Secondary | ICD-10-CM

## 2012-06-12 DIAGNOSIS — E079 Disorder of thyroid, unspecified: Secondary | ICD-10-CM | POA: Insufficient documentation

## 2012-06-12 LAB — URINALYSIS, ROUTINE W REFLEX MICROSCOPIC
Bilirubin Urine: NEGATIVE
Glucose, UA: NEGATIVE mg/dL
Hgb urine dipstick: NEGATIVE
Ketones, ur: NEGATIVE mg/dL
Leukocytes, UA: NEGATIVE
Nitrite: NEGATIVE
Protein, ur: NEGATIVE mg/dL
Specific Gravity, Urine: <1.005 — ABNORMAL LOW (ref 1.005–1.030)
Urobilinogen, UA: 0.2 mg/dL (ref 0.0–1.0)
pH: 6 (ref 5.0–8.0)

## 2012-06-12 NOTE — MAU Note (Signed)
Patient presents to MAU with c/o high BP reading today at [facility]; reports HA and dizziness upon standing. Reports hx of HTN since 27yo; treated with diet, no medications.  Reports not having any PO intake since 0400 today. Denies vaginal bleeding, LOF or cramping.

## 2012-06-12 NOTE — MAU Note (Signed)
Pt presents with complaints of swelling in ankles and legs since this morning. States that the physician has been monitoring her blood pressures with this pregnancy and she went to [employer] and took blood pressure and it was elevated today. Spoke with Dr Ambrose Mantle and was told to come and be evaluated

## 2012-06-12 NOTE — MAU Provider Note (Signed)
History     CSN: [id number]  Arrival date & time 06/12/12  1543   None     Chief Complaint  Patient presents with  . Hypertension  . Leg Swelling    (Consider location/radiation/quality/duration/timing/severity/associated sxs/prior treatment) HPI Grace Simpson is a 27 y.o. G2P1001 at [redacted]w[redacted]d. She has hx HTN since age 35, usually managed with diet and exercise. She was in HT this afternoon and checked it, was 148/80. She had noticed started to have swelling in her ankles and lower legs. She denies ha, visual changes. Past Medical History  Diagnosis Date  . Hypertension   . Hypothyroidism   . Obesity, morbid (more than 100 lbs over ideal weight or BMI > 40)   . Bipolar affective   . OCD (obsessive compulsive disorder)   . Oppositional defiant disorder   . Obstructive sleep apnea   . Pregnancy induced hypertension     unsure if PIH or CHTN  . Herpes     last outbreak 4 months ago  . Hyperprolactinemia   . Pyelonephritis   . GERD (gastroesophageal reflux disease)   . Left breast abscess     not MRSA  . UTI (lower urinary tract infection)   . Trichomonas   . BV (bacterial vaginosis)   . Hemorrhoids   . Anxiety     Past Surgical History  Procedure Laterality Date  . Tonsillectomy    . Left breast abcess    . Cholecystectomy    . Adenoidectomy    . Cesarean section  11/20/2010    Procedure: CESAREAN SECTION;  Surgeon: Oliver Pila;  Location: [facility];  Service: Gynecology;  Laterality: N/A;    Family History  Problem Relation Age of Onset  . Adopted: Yes    History  Substance Use Topics  . Smoking status: Former Smoker    Quit date: 03/29/2010  . Smokeless tobacco: Never Used  . Alcohol Use: No    OB History   Grav Para Term Preterm Abortions TAB SAB Ect Mult Living   2 1 1  0 0 0 0 0 0 1      Review of Systems  Constitutional: Negative for fever and chills.  Gastrointestinal: Negative for abdominal pain.  Genitourinary: Negative for frequency,  vaginal bleeding and vaginal discharge.    Allergies  Corticosteroids and Diphenhydramine hcl  Home Medications  No current outpatient prescriptions on file.  BP 134/77  Pulse 93  Temp(Src) 98.1 F (36.7 C) (Oral)  Resp 20  Ht 5' (1.524 m)  Wt 281 lb (127.461 kg)  BMI 54.88 kg/m2  LMP 01/19/2012  134/77, 120/69, 131/84 and 131/85 serial B/P's  Physical Exam  Constitutional: She is oriented to person, place, and time. She appears well-developed and well-nourished.  Musculoskeletal: Normal range of motion.  No edema bil feet/ankles  Neurological: She is alert and oriented to person, place, and time.  Skin: Skin is warm and dry.  Psychiatric: She has a normal mood and affect. Her behavior is normal.    ED Course  Procedures (including critical care time)  Labs Reviewed  URINALYSIS, ROUTINE W REFLEX MICROSCOPIC   ASSESSMENT:  20 5/7 wks with pre-existing hypertension Normal B/P's here today, no appreciable edema   PLAN:  Increase water when out in warm weather to reduce swelling Continue to monitor her B/P as needed Keep next appt in the office in 3 wks Dr Ambrose Mantle aware       MDM

## 2012-08-02 LAB — OB RESULTS CONSOLE HIV ANTIBODY (ROUTINE TESTING): HIV: NONREACTIVE

## 2012-09-16 ENCOUNTER — Ambulatory Visit: Payer: Medicaid Other | Admitting: Cardiology

## 2012-09-21 ENCOUNTER — Encounter: Payer: Self-pay | Admitting: Cardiovascular Disease

## 2012-09-21 ENCOUNTER — Ambulatory Visit (INDEPENDENT_AMBULATORY_CARE_PROVIDER_SITE_OTHER): Payer: Medicaid Other | Admitting: Cardiovascular Disease

## 2012-09-21 VITALS — BP 128/88 | HR 96 | Ht 61.0 in | Wt 297.4 lb

## 2012-09-21 DIAGNOSIS — R0600 Dyspnea, unspecified: Secondary | ICD-10-CM

## 2012-09-21 DIAGNOSIS — R0609 Other forms of dyspnea: Secondary | ICD-10-CM

## 2012-09-21 DIAGNOSIS — R079 Chest pain, unspecified: Secondary | ICD-10-CM

## 2012-09-21 NOTE — Patient Instructions (Addendum)
Your physician has requested that you have an echocardiogram. Echocardiography is a painless test that uses sound waves to create images of your heart. It provides your doctor with information about the size and shape of your heart and how well your heart's chambers and valves are working. This procedure takes approximately one hour. There are no restrictions for this procedure.  Your physician recommends that you schedule a follow-up appointment if the echocardiogram is abnormal or if the symptoms fail to resolve after your delivery.

## 2012-09-21 NOTE — Progress Notes (Signed)
Patient ID: Grace Simpson, female   DOB: 1985/07/30, 27 y.o.   MRN: [id number]     Reason for office visit Chest pain and shortness of breath  This is my first encounter with Grace Simpson who is now [redacted] weeks pregnant and began experiencing chest pain and shortness of breath roughly 2 weeks ago. The symptoms occur randomly and are never severe. The chest discomfort is described as a sensation of pressure. There is no clear correlation between level of activity and the occurrence of chest pain. In fact all the episodes appear to occur at rest and are not worsened by walking. It is not appear to be any association with her position. There is also no association with meal time.  She has also began expressing dyspnea but it seems that she describes fatigue as much is true shortness of breath. This does appear to have some exertional component although it can happen randomly as well. The episodes of shortness of breath and chest pain never lasts more than about 10 minutes at a time and resolves continuously.  She does not have gestational hypertension/eclampsia or diabetes. She has had "up and down" blood pressure issues with both her pregnancies, but not enough to warrant any medical therapy.    Allergies  Allergen Reactions  . Corticosteroids Other (See Comments)    Adverse effect  . Diphenhydramine Hcl Other (See Comments)    Adverse effect    Current Outpatient Prescriptions  Medication Sig Dispense Refill  . levothyroxine (SYNTHROID, LEVOTHROID) 112 MCG tablet Take 112 mcg by mouth every morning.       . ranitidine (ZANTAC) 150 MG capsule Take 150 mg by mouth as needed for heartburn.       No current facility-administered medications for this visit.    Past Medical History  Diagnosis Date  . Hypertension   . Hypothyroidism   . Obesity, morbid (more than 100 lbs over ideal weight or BMI > 40)   . Bipolar affective   . OCD (obsessive compulsive disorder)   . Oppositional defiant disorder    . Obstructive sleep apnea   . Pregnancy induced hypertension     unsure if PIH or CHTN  . Herpes     last outbreak 4 months ago  . Hyperprolactinemia   . Pyelonephritis   . GERD (gastroesophageal reflux disease)   . Left breast abscess     not MRSA  . UTI (lower urinary tract infection)   . Trichomonas   . BV (bacterial vaginosis)   . Hemorrhoids   . Anxiety     Past Surgical History  Procedure Laterality Date  . Tonsillectomy    . Left breast abcess    . Cholecystectomy    . Adenoidectomy    . Cesarean section  11/20/2010    Procedure: CESAREAN SECTION;  Surgeon: Oliver Pila;  Location: [facility];  Service: Gynecology;  Laterality: N/A;    Family History  Problem Relation Age of Onset  . Adopted: Yes    History   Social History  . Marital Status: Married    Spouse Name: N/A    Number of Children: N/A  . Years of Education: N/A   Occupational History  . Not on file.   Social History Main Topics  . Smoking status: Former Smoker    Quit date: 03/29/2010  . Smokeless tobacco: Never Used  . Alcohol Use: No  . Drug Use: No  . Sexually Active: Not Currently    Birth Control/ Protection: None  Other Topics Concern  . Not on file   Social History Narrative  . No narrative on file    Review of systems: The patient specifically denies any orthopnea, paroxysmal nocturnal dyspnea, syncope, palpitations, focal neurological deficits, intermittent claudication, lower extremity edema, unexplained weight gain, cough, hemoptysis or wheezing.  The patient also denies abdominal pain, nausea, vomiting, dysphagia, diarrhea, constipation, polyuria, polydipsia, dysuria, hematuria, frequency, urgency, abnormal bleeding or bruising, fever, chills, unexpected weight changes, mood swings, change in skin or hair texture, change in voice quality, auditory or visual problems, allergic reactions or rashes, new musculoskeletal complaints other than usual "aches and  pains".   PHYSICAL EXAM BP 128/88  Pulse 96  Ht 5\' 1"  (1.549 m)  Wt 297 lb 6.4 oz (134.9 kg)  BMI 56.22 kg/m2  LMP 01/19/2012  General: Alert, oriented x3, no distress Head: no evidence of trauma, PERRL, EOMI, no exophtalmos or lid lag, no myxedema, no xanthelasma; normal ears, nose and oropharynx Neck: normal jugular venous pulsations and no hepatojugular reflux; brisk carotid pulses without delay and no carotid bruits Chest: clear to auscultation, no signs of consolidation by percussion or palpation, normal fremitus, symmetrical and full respiratory excursions Cardiovascular: normal position and quality of the apical impulse, regular rhythm, normal first and second heart sounds, no murmurs, rubs or gallops Abdomen: Gravid uterus, third trimester, otherwise no tenderness or distention, no masses by palpation, no abnormal pulsatility or arterial bruits, normal bowel sounds, no hepatosplenomegaly Extremities: no clubbing, cyanosis or edema; 2+ radial, ulnar and brachial pulses bilaterally; 2+ right femoral, posterior tibial and dorsalis pedis pulses; 2+ left femoral, posterior tibial and dorsalis pedis pulses; no subclavian or femoral bruits Neurological: grossly nonfocal   EKG: Normal sinus rhythm, normal tracing  Lipid Panel    BMET    Component Value Date/Time   NA 138 02/27/2012 1630   K 3.7 02/27/2012 1630   CL 107 02/27/2012 1630   CO2 22 02/25/2011 0415   GLUCOSE 85 02/27/2012 1630   BUN 9 02/27/2012 1630   CREATININE 0.80 02/27/2012 1630   CALCIUM 9.7 02/25/2011 0415   GFRNONAA >90 02/25/2011 0415   GFRAA >90 02/25/2011 0415     ASSESSMENT AND PLAN Chest pain It does not appear to be associated with a certain position, level of activity or meals. It resolved spontaneously without any particular intervention. The pattern is atypical. Her electrocardiogram is normal. She has very limited if any risk factors for atherosclerotic coronary artery disease. Her symptoms are not truly  compatible with coronary dissection. There does appear to be in association with the last trimester of pregnancy it may simply be that she has worsening acid reflux as a result of the pregnant uterus. Specific evaluation with a stress test or angiogram does not appear to be either necessary nor safe in the last trimester of pregnancy.  Dyspnea Dyspnea occurs randomly but also seems to have some association with activity. Again it may be associated with weight gain and third trimester pregnancy. She does have obstructive sleep apnea and I think an echocardiogram will be useful to evaluate for signs of pulmonary artery hypertension. If the echo is normal there is normal left ventricular systolic function, I would probably wait to see if the symptoms resolve after delivery before pursuing more aggressive diagnostic testing.  Orders Placed This Encounter  Procedures  . EKG 12-Lead  . 2D Echocardiogram with contrast   Meds ordered this encounter  Medications  . ranitidine (ZANTAC) 150 MG capsule    Sig:  Take 150 mg by mouth as needed for heartburn.    Junious Silk, MD, [facility] [phone] office [phone] pager

## 2012-09-21 NOTE — Assessment & Plan Note (Signed)
Dyspnea occurs randomly but also seems to have some association with activity. Again it may be associated with weight gain and third trimester pregnancy. She does have obstructive sleep apnea and I think an echocardiogram will be useful to evaluate for signs of pulmonary artery hypertension. If the echo is normal there is normal left ventricular systolic function, I would probably wait to see if the symptoms resolve after delivery before pursuing more aggressive diagnostic testing.

## 2012-09-21 NOTE — Assessment & Plan Note (Addendum)
It does not appear to be associated with a certain position, level of activity or meals. It resolved spontaneously without any particular intervention. The pattern is atypical. Her electrocardiogram is normal. She has very limited if any risk factors for atherosclerotic coronary artery disease. Her symptoms are not truly compatible with coronary dissection. There does appear to be in association with the last trimester of pregnancy it may simply be that she has worsening acid reflux as a result of the pregnant uterus. Specific evaluation with a stress test or angiogram does not appear to be either necessary nor safe in the last trimester of pregnancy.

## 2012-09-27 ENCOUNTER — Ambulatory Visit (HOSPITAL_COMMUNITY)
Admission: RE | Admit: 2012-09-27 | Discharge: 2012-09-27 | Disposition: A | Discharge status: Home / Self Care | Payer: Medicaid Other | Source: Ambulatory Visit | Attending: Cardiovascular Disease | Admitting: Cardiovascular Disease

## 2012-09-27 DIAGNOSIS — R0989 Other specified symptoms and signs involving the circulatory and respiratory systems: Secondary | ICD-10-CM | POA: Insufficient documentation

## 2012-09-27 DIAGNOSIS — O99891 Other specified diseases and conditions complicating pregnancy: Secondary | ICD-10-CM | POA: Insufficient documentation

## 2012-09-27 DIAGNOSIS — G4733 Obstructive sleep apnea (adult) (pediatric): Secondary | ICD-10-CM | POA: Insufficient documentation

## 2012-09-27 DIAGNOSIS — E669 Obesity, unspecified: Secondary | ICD-10-CM | POA: Insufficient documentation

## 2012-09-27 DIAGNOSIS — R0609 Other forms of dyspnea: Secondary | ICD-10-CM | POA: Insufficient documentation

## 2012-09-27 DIAGNOSIS — R0600 Dyspnea, unspecified: Secondary | ICD-10-CM

## 2012-09-27 DIAGNOSIS — R079 Chest pain, unspecified: Secondary | ICD-10-CM

## 2012-09-27 NOTE — Progress Notes (Signed)
[facility]   2D echo completed 09/27/2012.   Cindy Sandar Krinke, RDCS  

## 2012-10-13 ENCOUNTER — Telehealth: Payer: Self-pay | Admitting: Cardiovascular Disease

## 2012-10-13 NOTE — Telephone Encounter (Signed)
Would like echo results from 2 weeks ago.

## 2012-10-14 NOTE — Telephone Encounter (Signed)
Informed patient that Dr C states results echo was normal. The results were release into her MyChart. She request a copy sent to her ob/gyn Dr  Delmar Landau .  Echo sent via epic faxed

## 2012-10-18 ENCOUNTER — Encounter (HOSPITAL_COMMUNITY): Payer: Self-pay | Admitting: Pharmacist

## 2012-10-19 NOTE — Patient Instructions (Addendum)
Your procedure is scheduled on: 10/21/2012  Enter through the Main Entrance of [facility] at: 0730AM  Pick up the phone at the desk and dial [phone].  Call this number if you have problems the morning of surgery: [phone].  Remember: Do NOT eat food: AFTER MIDNIGHT 10/20/2012 Do NOT drink clear liquids after: AFTER MIDNIGHT 10/20/2012 Take these medicines the morning of surgery with a SIP OF WATER:   Synthroid and zantac  Do NOT wear jewelry, make-up, or nail polish. Do NOT wear lotions, powders, or perfumes.  You may wear deodorant. Do NOT shave for 48 hours prior to surgery. Do NOT bring valuables to the hospital. Contacts, dentures, or bridgework may not be worn into surgery.  Leave suitcase in car.  After surgery it may be brought to your room.  For patients admitted to the hospital, checkout time is 11:00 AM the day of discharge.  Home with father Arther Dames  and husband Hollice Espy.

## 2012-10-20 ENCOUNTER — Encounter (HOSPITAL_COMMUNITY)
Admission: RE | Admit: 2012-10-20 | Discharge: 2012-10-20 | Disposition: A | Discharge status: Home / Self Care | Payer: Medicaid Other | Source: Ambulatory Visit | Attending: Obstetrics and Gynecology | Admitting: Obstetrics and Gynecology

## 2012-10-20 ENCOUNTER — Encounter (HOSPITAL_COMMUNITY): Payer: Self-pay

## 2012-10-20 HISTORY — DX: Other seasonal allergic rhinitis: J30.2

## 2012-10-20 LAB — CBC
HCT: 39 % (ref 36.0–46.0)
Hemoglobin: 13.6 g/dL (ref 12.0–15.0)
MCV: 89.9 fL (ref 78.0–100.0)
WBC: 7.6 10*3/uL (ref 4.0–10.5)

## 2012-10-20 LAB — COMPREHENSIVE METABOLIC PANEL
Alkaline Phosphatase: 101 U/L (ref 39–117)
BUN: 6 mg/dL (ref 6–23)
Chloride: 104 mEq/L (ref 96–112)
Creatinine, Ser: 0.58 mg/dL (ref 0.50–1.10)
GFR calc Af Amer: >90 mL/min (ref >90)
GFR calc non Af Amer: >90 mL/min (ref >90)
Glucose, Bld: 103 mg/dL — ABNORMAL HIGH (ref 70–99)
Potassium: 3.9 mEq/L (ref 3.5–5.1)
Total Bilirubin: 0.2 mg/dL — ABNORMAL LOW (ref 0.3–1.2)

## 2012-10-20 LAB — RPR: RPR Ser Ql: NONREACTIVE

## 2012-10-20 MED ORDER — DEXTROSE 5 % IV SOLN
3.0000 g | INTRAVENOUS | Status: AC
  Administered 2012-10-21: 3 g via INTRAVENOUS
  Filled 2012-10-20: qty 3000

## 2012-10-20 NOTE — H&P (Signed)
Grace Simpson is a 27 y.o. female G2P1001 at 39+ weeks (EDD 10/25/12 by LMP c/w 9 week [place]) presenting for scheduled repeat c-section.  Prenatal care is significant for prior c-section at term for a breech presentation, she now declines TOL and cervix remains unfavorable.  She has a h/o HSV and has been on valtrex suppression since 36 weeks.  She has a h/o hypothyroidism, stable on meds as well as depression which is stable.  She had some palpitations and SOB this pregnancy, worked up by cardioology with normal ECHO.    Maternal Medical History:  Contractions: Frequency: rare.   Perceived severity is mild.    Fetal activity: Perceived fetal activity is normal.    Prenatal Complications - Diabetes: none.    OB History   Grav Para Term Preterm Abortions TAB SAB Ect Mult Living   2 1 1  0 0 0 0 0 0 1    10/12 LTCS breech  Past Medical History  Diagnosis Date  . Hypothyroidism   . Obesity, morbid (more than 100 lbs over ideal weight or BMI > 40)   . Bipolar affective   . OCD (obsessive compulsive disorder)   . Oppositional defiant disorder   . Herpes     last outbreak 4 months ago  . Hyperprolactinemia   . GERD (gastroesophageal reflux disease)   . Left breast abscess     not MRSA  . UTI (lower urinary tract infection)   . Trichomonas   . BV (bacterial vaginosis)   . Hemorrhoids   . Hypertension   . Pregnancy induced hypertension     unsure if PIH or CHTN - no meds  . Anxiety     no meds currently  . Obstructive sleep apnea     does not use CPAP  . Seasonal allergies   . Pyelonephritis     history - no current problem   Past Surgical History  Procedure Laterality Date  . Tonsillectomy    . Left breast abcess    . Cholecystectomy    . Adenoidectomy    . Cesarean section  11/20/2010    Procedure: CESAREAN SECTION;  Surgeon: Oliver Pila;  Location: [facility];  Service: Gynecology;  Laterality: N/A;  . Wisdom tooth extraction     Family History: family history is not on  file. She was adopted. Social History:  reports that she quit smoking about 7 months ago. Her smoking use included Cigarettes. She smoked 0.50 packs per day. She has never used smokeless tobacco. She reports that she does not drink alcohol or use illicit drugs.   Prenatal Transfer Tool  Maternal Diabetes: No Genetic Screening: Normal Maternal Ultrasounds/Referrals: Normal Fetal Ultrasounds or other Referrals:  None Maternal Substance Abuse:  No Significant Maternal Medications:  Meds include: Syntroid Significant Maternal Lab Results:  None Other Comments:  None  ROS    Last menstrual period 01/19/2012. Maternal Exam:  Uterine Assessment: Contraction strength is mild.  Contraction frequency is rare.   Abdomen: Patient reports no abdominal tenderness. Fetal presentation: vertex  Introitus: Normal vulva. Normal vagina.    Physical Exam  Constitutional: She is oriented to person, place, and time. She appears well-developed and well-nourished.  Cardiovascular: Normal rate and regular rhythm.   Respiratory: Effort normal.  GI: Soft.  Genitourinary: Vagina normal and uterus normal.  Neurological: She is alert and oriented to person, place, and time.  Psychiatric: She has a normal mood and affect. Her behavior is normal.    Prenatal labs:  ABO, Rh: --/--/B NEG (09/04 1345) Antibody: NEG (09/04 1345) Rubella:  Immune RPR: Nonreactive (06/17 0000)  HBsAg:   Neg HIV: Non-reactive (06/17 0000)  GBS:   Neg First trimester screen and AFP NEG One hour GTT 96  Assessment/Plan: Pt was counseled on the risks and benefits of c-section including bleeding, infection and possible damage to bowel and bladder.  She would accept blood if needed. She is ready to proceed.    Oliver Pila 10/20/2012, 6:59 PM

## 2012-10-20 NOTE — Pre-Procedure Instructions (Signed)
Patient is breast feeding while in the hospital. 

## 2012-10-21 ENCOUNTER — Encounter (HOSPITAL_COMMUNITY): Payer: Self-pay | Admitting: Anesthesiology

## 2012-10-21 ENCOUNTER — Inpatient Hospital Stay (HOSPITAL_COMMUNITY): Payer: Medicaid Other | Admitting: Anesthesiology

## 2012-10-21 ENCOUNTER — Inpatient Hospital Stay (HOSPITAL_COMMUNITY)
Admission: RE | Admit: 2012-10-21 | Discharge: 2012-10-23 | DRG: 766 | Disposition: A | Discharge status: Home / Self Care | Payer: Medicaid Other | Source: Ambulatory Visit | Attending: Obstetrics and Gynecology | Admitting: Obstetrics and Gynecology

## 2012-10-21 ENCOUNTER — Encounter (HOSPITAL_COMMUNITY)
Admission: RE | Disposition: A | Discharge status: Home / Self Care | Payer: Self-pay | Source: Ambulatory Visit | Attending: Obstetrics and Gynecology

## 2012-10-21 DIAGNOSIS — E039 Hypothyroidism, unspecified: Secondary | ICD-10-CM | POA: Diagnosis present

## 2012-10-21 DIAGNOSIS — E079 Disorder of thyroid, unspecified: Secondary | ICD-10-CM | POA: Diagnosis present

## 2012-10-21 DIAGNOSIS — Z98891 History of uterine scar from previous surgery: Secondary | ICD-10-CM

## 2012-10-21 DIAGNOSIS — E669 Obesity, unspecified: Secondary | ICD-10-CM | POA: Diagnosis present

## 2012-10-21 DIAGNOSIS — O34219 Maternal care for unspecified type scar from previous cesarean delivery: Principal | ICD-10-CM | POA: Diagnosis present

## 2012-10-21 HISTORY — DX: History of uterine scar from previous surgery: Z98.891

## 2012-10-21 LAB — PREPARE RBC (CROSSMATCH)

## 2012-10-21 SURGERY — Surgical Case
Anesthesia: Spinal | Site: Abdomen | Wound class: Clean Contaminated

## 2012-10-21 MED ORDER — MEPERIDINE HCL 25 MG/ML IJ SOLN: 6.2500 mg | INTRAMUSCULAR | Status: DC | PRN

## 2012-10-21 MED ORDER — OXYTOCIN 40 UNITS IN LACTATED RINGERS INFUSION - SIMPLE MED: 62.5000 mL/h | INTRAVENOUS | Status: AC

## 2012-10-21 MED ORDER — SCOPOLAMINE 1 MG/3DAYS TD PT72
MEDICATED_PATCH | TRANSDERMAL | Status: AC
  Administered 2012-10-21: 1.5 mg via TRANSDERMAL
  Filled 2012-10-21: qty 1

## 2012-10-21 MED ORDER — IBUPROFEN 600 MG PO TABS
600.0000 mg | ORAL_TABLET | Freq: Four times a day (QID) | ORAL | Status: DC
  Administered 2012-10-21 – 2012-10-23 (×6): 600 mg via ORAL
  Filled 2012-10-21 (×6): qty 1

## 2012-10-21 MED ORDER — NALBUPHINE SYRINGE 5 MG/0.5 ML
INJECTION | INTRAMUSCULAR | Status: AC
  Administered 2012-10-21: 10 mg via SUBCUTANEOUS
  Filled 2012-10-21: qty 1

## 2012-10-21 MED ORDER — MORPHINE SULFATE 0.5 MG/ML IJ SOLN
INTRAMUSCULAR | Status: AC
  Filled 2012-10-21: qty 10

## 2012-10-21 MED ORDER — PHENYLEPHRINE HCL 10 MG/ML IJ SOLN
INTRAMUSCULAR | Status: DC | PRN
  Administered 2012-10-21 (×3): 80 ug via INTRAVENOUS
  Administered 2012-10-21 (×3): 40 ug via INTRAVENOUS
  Administered 2012-10-21: 80 ug via INTRAVENOUS
  Administered 2012-10-21: 40 ug via INTRAVENOUS

## 2012-10-21 MED ORDER — LACTATED RINGERS IV SOLN
INTRAVENOUS | Status: DC
  Administered 2012-10-21: 20:00:00 via INTRAVENOUS

## 2012-10-21 MED ORDER — SENNOSIDES-DOCUSATE SODIUM 8.6-50 MG PO TABS
2.0000 | ORAL_TABLET | Freq: Every day | ORAL | Status: DC
  Administered 2012-10-21 – 2012-10-23 (×2): 2 via ORAL

## 2012-10-21 MED ORDER — ZOLPIDEM TARTRATE 5 MG PO TABS: 5.0000 mg | ORAL_TABLET | Freq: Every evening | ORAL | Status: DC | PRN

## 2012-10-21 MED ORDER — OXYTOCIN 10 UNIT/ML IJ SOLN
40.0000 [IU] | INTRAVENOUS | Status: DC | PRN
  Administered 2012-10-21: 40 [IU] via INTRAVENOUS

## 2012-10-21 MED ORDER — NALOXONE HCL 1 MG/ML IJ SOLN
1.0000 ug/kg/h | INTRAVENOUS | Status: DC | PRN
  Filled 2012-10-21: qty 2

## 2012-10-21 MED ORDER — LACTATED RINGERS IV SOLN
INTRAVENOUS | Status: DC
  Administered 2012-10-21 (×3): via INTRAVENOUS

## 2012-10-21 MED ORDER — NALOXONE HCL 0.4 MG/ML IJ SOLN: 0.4000 mg | INTRAMUSCULAR | Status: DC | PRN

## 2012-10-21 MED ORDER — LANOLIN HYDROUS EX OINT: 1.0000 "application " | TOPICAL_OINTMENT | CUTANEOUS | Status: DC | PRN

## 2012-10-21 MED ORDER — BUPIVACAINE IN DEXTROSE 0.75-8.25 % IT SOLN
INTRATHECAL | Status: DC | PRN
  Administered 2012-10-21: 1.4 mL via INTRATHECAL

## 2012-10-21 MED ORDER — WITCH HAZEL-GLYCERIN EX PADS: 1.0000 "application " | MEDICATED_PAD | CUTANEOUS | Status: DC | PRN

## 2012-10-21 MED ORDER — KETOROLAC TROMETHAMINE 30 MG/ML IJ SOLN
30.0000 mg | Freq: Four times a day (QID) | INTRAMUSCULAR | Status: AC | PRN
  Administered 2012-10-21: 30 mg via INTRAMUSCULAR

## 2012-10-21 MED ORDER — SCOPOLAMINE 1 MG/3DAYS TD PT72
1.0000 | MEDICATED_PATCH | Freq: Once | TRANSDERMAL | Status: DC
  Administered 2012-10-21: 1.5 mg via TRANSDERMAL

## 2012-10-21 MED ORDER — SCOPOLAMINE 1 MG/3DAYS TD PT72: 1.0000 | MEDICATED_PATCH | Freq: Once | TRANSDERMAL | Status: DC

## 2012-10-21 MED ORDER — NALBUPHINE SYRINGE 5 MG/0.5 ML
5.0000 mg | INJECTION | INTRAMUSCULAR | Status: DC | PRN
  Filled 2012-10-21: qty 1

## 2012-10-21 MED ORDER — NALBUPHINE SYRINGE 5 MG/0.5 ML
5.0000 mg | INJECTION | INTRAMUSCULAR | Status: DC | PRN
  Administered 2012-10-21: 10 mg via SUBCUTANEOUS
  Filled 2012-10-21: qty 1

## 2012-10-21 MED ORDER — LACTATED RINGERS IV SOLN: INTRAVENOUS | Status: DC

## 2012-10-21 MED ORDER — SODIUM CHLORIDE 0.9 % IJ SOLN: 3.0000 mL | INTRAMUSCULAR | Status: DC | PRN

## 2012-10-21 MED ORDER — OXYCODONE-ACETAMINOPHEN 5-325 MG PO TABS
1.0000 | ORAL_TABLET | ORAL | Status: DC | PRN
  Administered 2012-10-22 – 2012-10-23 (×6): 1 via ORAL
  Filled 2012-10-21 (×6): qty 1

## 2012-10-21 MED ORDER — METOCLOPRAMIDE HCL 5 MG/ML IJ SOLN: 10.0000 mg | Freq: Once | INTRAMUSCULAR | Status: DC | PRN

## 2012-10-21 MED ORDER — FENTANYL CITRATE 0.05 MG/ML IJ SOLN
INTRAMUSCULAR | Status: AC
  Filled 2012-10-21: qty 2

## 2012-10-21 MED ORDER — ONDANSETRON HCL 4 MG/2ML IJ SOLN: 4.0000 mg | INTRAMUSCULAR | Status: DC | PRN

## 2012-10-21 MED ORDER — PRENATAL MULTIVITAMIN CH
1.0000 | ORAL_TABLET | Freq: Every day | ORAL | Status: DC
  Administered 2012-10-21 – 2012-10-22 (×2): 1 via ORAL
  Filled 2012-10-21 (×2): qty 1

## 2012-10-21 MED ORDER — TETANUS-DIPHTH-ACELL PERTUSSIS 5-2.5-18.5 LF-MCG/0.5 IM SUSP
0.5000 mL | Freq: Once | INTRAMUSCULAR | Status: AC
  Administered 2012-10-23: 0.5 mL via INTRAMUSCULAR

## 2012-10-21 MED ORDER — MORPHINE SULFATE (PF) 0.5 MG/ML IJ SOLN
INTRAMUSCULAR | Status: DC | PRN
  Administered 2012-10-21: .15 mg via INTRATHECAL

## 2012-10-21 MED ORDER — FENTANYL CITRATE 0.05 MG/ML IJ SOLN: 25.0000 ug | INTRAMUSCULAR | Status: DC | PRN

## 2012-10-21 MED ORDER — MENTHOL 3 MG MT LOZG: 1.0000 | LOZENGE | OROMUCOSAL | Status: DC | PRN

## 2012-10-21 MED ORDER — CEFAZOLIN SODIUM 1-5 GM-% IV SOLN
1.0000 g | Freq: Once | INTRAVENOUS | Status: DC
  Filled 2012-10-21: qty 50

## 2012-10-21 MED ORDER — 0.9 % SODIUM CHLORIDE (POUR BTL) OPTIME
TOPICAL | Status: DC | PRN
  Administered 2012-10-21: 1000 mL

## 2012-10-21 MED ORDER — LACTATED RINGERS IV SOLN
INTRAVENOUS | Status: DC
  Administered 2012-10-21: 08:00:00 via INTRAVENOUS

## 2012-10-21 MED ORDER — ONDANSETRON HCL 4 MG PO TABS: 4.0000 mg | ORAL_TABLET | ORAL | Status: DC | PRN

## 2012-10-21 MED ORDER — KETOROLAC TROMETHAMINE 30 MG/ML IJ SOLN
INTRAMUSCULAR | Status: AC
  Administered 2012-10-21: 30 mg via INTRAMUSCULAR
  Filled 2012-10-21: qty 1

## 2012-10-21 MED ORDER — ONDANSETRON HCL 4 MG/2ML IJ SOLN
INTRAMUSCULAR | Status: AC
  Filled 2012-10-21: qty 2

## 2012-10-21 MED ORDER — METOCLOPRAMIDE HCL 5 MG/ML IJ SOLN: 10.0000 mg | Freq: Three times a day (TID) | INTRAMUSCULAR | Status: DC | PRN

## 2012-10-21 MED ORDER — SIMETHICONE 80 MG PO CHEW: 80.0000 mg | CHEWABLE_TABLET | ORAL | Status: DC | PRN

## 2012-10-21 MED ORDER — KETOROLAC TROMETHAMINE 30 MG/ML IJ SOLN
30.0000 mg | Freq: Four times a day (QID) | INTRAMUSCULAR | Status: AC | PRN
  Administered 2012-10-21: 30 mg via INTRAVENOUS
  Filled 2012-10-21: qty 1

## 2012-10-21 MED ORDER — ONDANSETRON HCL 4 MG/2ML IJ SOLN
INTRAMUSCULAR | Status: DC | PRN
  Administered 2012-10-21: 4 mg via INTRAVENOUS

## 2012-10-21 MED ORDER — FENTANYL CITRATE 0.05 MG/ML IJ SOLN
INTRAMUSCULAR | Status: DC | PRN
  Administered 2012-10-21: 25 ug via INTRATHECAL

## 2012-10-21 MED ORDER — SIMETHICONE 80 MG PO CHEW
80.0000 mg | CHEWABLE_TABLET | Freq: Three times a day (TID) | ORAL | Status: DC
  Administered 2012-10-21 – 2012-10-23 (×7): 80 mg via ORAL

## 2012-10-21 MED ORDER — CEFAZOLIN SODIUM-DEXTROSE 2-3 GM-% IV SOLR
INTRAVENOUS | Status: AC
  Filled 2012-10-21: qty 50

## 2012-10-21 MED ORDER — LEVOTHYROXINE SODIUM 112 MCG PO TABS
112.0000 ug | ORAL_TABLET | Freq: Every day | ORAL | Status: DC
  Administered 2012-10-21 – 2012-10-23 (×3): 112 ug via ORAL
  Filled 2012-10-21 (×3): qty 1

## 2012-10-21 MED ORDER — PHENYLEPHRINE 40 MCG/ML (10ML) SYRINGE FOR IV PUSH (FOR BLOOD PRESSURE SUPPORT)
PREFILLED_SYRINGE | INTRAVENOUS | Status: AC
  Filled 2012-10-21: qty 10

## 2012-10-21 MED ORDER — OXYTOCIN 10 UNIT/ML IJ SOLN
INTRAMUSCULAR | Status: AC
  Filled 2012-10-21: qty 4

## 2012-10-21 MED ORDER — DIBUCAINE 1 % RE OINT: 1.0000 "application " | TOPICAL_OINTMENT | RECTAL | Status: DC | PRN

## 2012-10-21 MED ORDER — ONDANSETRON HCL 4 MG/2ML IJ SOLN: 4.0000 mg | Freq: Three times a day (TID) | INTRAMUSCULAR | Status: DC | PRN

## 2012-10-21 SURGICAL SUPPLY — 34 items
APL SKNCLS STERI-STRIP NONHPOA (GAUZE/BANDAGES/DRESSINGS) ×1
BENZOIN TINCTURE PRP APPL 2/3 (GAUZE/BANDAGES/DRESSINGS) ×1 IMPLANT
CLAMP CORD UMBIL (MISCELLANEOUS) IMPLANT
CLOTH BEACON ORANGE TIMEOUT ST (SAFETY) ×2 IMPLANT
CONTAINER PREFILL 10% NBF 15ML (MISCELLANEOUS) IMPLANT
DRAPE LG THREE QUARTER DISP (DRAPES) ×2 IMPLANT
DRSG OPSITE POSTOP 4X10 (GAUZE/BANDAGES/DRESSINGS) ×2 IMPLANT
DURAPREP 26ML APPLICATOR (WOUND CARE) ×2 IMPLANT
ELECT REM PT RETURN 9FT ADLT (ELECTROSURGICAL) ×2
ELECTRODE REM PT RTRN 9FT ADLT (ELECTROSURGICAL) ×1 IMPLANT
EXTRACTOR VACUUM KIWI (MISCELLANEOUS) IMPLANT
EXTRACTOR VACUUM M CUP 4 TUBE (SUCTIONS) IMPLANT
GLOVE BIO SURGEON STRL SZ 6.5 (GLOVE) ×2 IMPLANT
GOWN STRL REIN XL XLG (GOWN DISPOSABLE) ×4 IMPLANT
KIT ABG SYR 3ML LUER SLIP (SYRINGE) IMPLANT
NDL HYPO 25X5/8 SAFETYGLIDE (NEEDLE) IMPLANT
NEEDLE HYPO 25X5/8 SAFETYGLIDE (NEEDLE) IMPLANT
NS IRRIG 1000ML POUR BTL (IV SOLUTION) ×2 IMPLANT
PACK C SECTION WH (CUSTOM PROCEDURE TRAY) ×2 IMPLANT
PAD OB MATERNITY 4.3X12.25 (PERSONAL CARE ITEMS) ×2 IMPLANT
RTRCTR C-SECT PINK 25CM LRG (MISCELLANEOUS) ×2 IMPLANT
STAPLER VISISTAT 35W (STAPLE) ×1 IMPLANT
STRIP CLOSURE SKIN 1/2X4 (GAUZE/BANDAGES/DRESSINGS) IMPLANT
SUT CHROMIC 1 CTX 36 (SUTURE) ×4 IMPLANT
SUT PLAIN 0 NONE (SUTURE) IMPLANT
SUT PLAIN 2 0 XLH (SUTURE) ×1 IMPLANT
SUT VIC AB 0 CT1 27 (SUTURE) ×4
SUT VIC AB 0 CT1 27XBRD ANBCTR (SUTURE) ×2 IMPLANT
SUT VIC AB 2-0 CT1 27 (SUTURE)
SUT VIC AB 2-0 CT1 TAPERPNT 27 (SUTURE) IMPLANT
SUT VIC AB 4-0 KS 27 (SUTURE) IMPLANT
TOWEL OR 17X24 6PK STRL BLUE (TOWEL DISPOSABLE) ×2 IMPLANT
TRAY FOLEY CATH 14FR (SET/KITS/TRAYS/PACK) ×2 IMPLANT
WATER STERILE IRR 1000ML POUR (IV SOLUTION) ×2 IMPLANT

## 2012-10-21 NOTE — Transfer of Care (Signed)
Immediate Anesthesia Transfer of Care Note  Patient: Grace Simpson  Procedure(s) Performed: Procedure(s): REPEAT CESAREAN SECTION (N/A)  Patient Location: PACU  Anesthesia Type:Spinal  Level of Consciousness: awake, alert  and oriented  Airway & Oxygen Therapy: Patient Spontanous Breathing  Post-op Assessment: Report given to PACU RN and Post -op Vital signs reviewed and stable  Post vital signs: stable  Complications: No apparent anesthesia complications

## 2012-10-21 NOTE — Op Note (Signed)
Operative note  Preoperative diagnosis Term pregnancy at 39 weeks and 3 days Prior C-section declines trial of labor Obesity  Postoperative diagnosis Same  Procedure Repeat low transverse cesarean section with 2 layer closure of uterus  Surgeon Dr. Huel Cote Dr. Sherron Monday and  Anesthesia Spinal  Fluids Estimated blood loss 800 cc Urine output 200 cc clear urine  IV fluids 2200 cc LR  Findings There is a viable female infant in the vertex presentation. Apgars were 9 and 9. Weight pending at time of dictation. Uterus tubes and ovaries appeared normal although the lower uterine segment was not well-developed.  Specimen Placenta sent to labor and delivery after cord blood donation  Procedure Patient was taken to the operating room where spinal anesthesia was obtained without difficulty. She was then prepped and draped in the normal sterile fashion in the dorsal supine position with a leftward tilt. An appropriate time out was performed. A Pfannenstiel skin incision was then made through pre-existing scar and carried through to underlying layer of fascia by sharp dissection and Bovie cautery. The fascia was then nicked in midline and the incision extended laterally. The inferior aspect was grasped with Coker clamps elevated and dissected off the underlying rectus muscles. The superior aspect was then likewise dissected off the rectus muscles. Rectus muscles are separated in the midline and the peritoneal cavity entered sharply. The peritoneal incision was then extended both superiorly and inferiorly with careful attention to avoid both bowel and bladder. The Alexis self-retaining wound retractor was then placed within the peritoneal cavity and lower uterus segment exposed. It was noted to not be very developed but was wide enough to make an incision. It was then incised in a transverse fashion and the cavity itself entered bluntly. Light meconium-stained fluid was noted and babies  head was easily delivered. The remainder of the body delivered without difficulty the cord was clamped and cut and handed to the waiting pediatricians. The placenta was then spontaneously expressed and handed off for cord blood donation the uterus was cleared of all clots and debris with a moist lap sponge. The uterine incision was then closed in 2 layers the first a running locked layer of 1-0 chromic. A second imbricating layer of the same suture was also used to close the incision. All appeared hemostatic, the gutters were cleared of all clots and debris, and the tubes and ovaries inspected and found to be normal. All instruments and sponges were then removed from the abdomen and the rectus muscles and peritoneum were reapproximated with several interrupted mattress sutures of 2-0 Vicryl. Fascia was then closed with 0 Vicryl in a running fashion. The subcutaneous tissue was reapproximated with 3-0 plain in a running fashion. The skin was closed with staples. Again all instruments and sponge counts were correct and the patient was taken to the recovery room in good condition with the baby accompanying her.

## 2012-10-21 NOTE — Anesthesia Procedure Notes (Signed)
Spinal  Patient location during procedure: OR Start time: 10/21/2012 9:07 AM Staffing Anesthesiologist: Taariq Leitz A. Performed by: anesthesiologist  Preanesthetic Checklist Completed: patient identified, site marked, surgical consent, pre-op evaluation, timeout performed, IV checked, risks and benefits discussed and monitors and equipment checked Spinal Block Patient position: sitting Prep: site prepped and draped and DuraPrep Patient monitoring: heart rate, cardiac monitor, continuous pulse ox and blood pressure Approach: midline Location: L3-4 Injection technique: single-shot Needle Needle type: Sprotte  Needle gauge: 24 G Needle length: 12.7 cm Needle insertion depth: 10 cm Assessment Sensory level: T3 Events: paresthesia Additional Notes Patient tolerated procedure well. Transient paresthesia right leg. Attempt x 4. Adequate sensory level.

## 2012-10-21 NOTE — Anesthesia Postprocedure Evaluation (Signed)
  Anesthesia Post-op Note  Patient: Grace Simpson  Procedure(s) Performed: Procedure(s): REPEAT CESAREAN SECTION (N/A)  Patient Location: Mother/Baby  Anesthesia Type:Spinal  Level of Consciousness: awake, alert  and oriented  Airway and Oxygen Therapy: Patient Spontanous Breathing  Post-op Pain: mild  Post-op Assessment: Post-op Vital signs reviewed, Patient's Cardiovascular Status Stable, No headache, No backache, No residual numbness and No residual motor weakness  Post-op Vital Signs: Reviewed and stable  Complications: No apparent anesthesia complications

## 2012-10-21 NOTE — Anesthesia Preprocedure Evaluation (Signed)
Anesthesia Evaluation  Patient identified by MRN, date of birth, ID band Patient awake    Reviewed: Allergy & Precautions, H&P , NPO status , Patient's Chart, lab work & pertinent test results  Airway Mallampati: II TM Distance: >3 FB Neck ROM: Full    Dental no notable dental hx. (+) Poor Dentition and Chipped   Pulmonary neg pulmonary ROS, shortness of breath and with exertion, sleep apnea , former smoker,  breath sounds clear to auscultation  Pulmonary exam normal       Cardiovascular hypertension, Rhythm:Regular Rate:Normal     Neuro/Psych PSYCHIATRIC DISORDERS Anxiety Bipolar Disorder OCD   GI/Hepatic GERD-  Medicated and Controlled,  Endo/Other  Hypothyroidism Morbid obesityHx/o Hyperprolactinemia  Renal/GU Renal diseaseHx/o UTI's  negative genitourinary   Musculoskeletal negative musculoskeletal ROS (+)   Abdominal (+) + obese,   Peds  Hematology negative hematology ROS (+)   Anesthesia Other Findings   Reproductive/Obstetrics (+) Pregnancy Previous C/Section for Breech                           Anesthesia Physical Anesthesia Plan  ASA: III  Anesthesia Plan: Spinal   Post-op Pain Management:    Induction:   Airway Management Planned: Natural Airway  Additional Equipment:   Intra-op Plan:   Post-operative Plan:   Informed Consent: I have reviewed the patients History and Physical, chart, labs and discussed the procedure including the risks, benefits and alternatives for the proposed anesthesia with the patient or authorized representative who has indicated his/her understanding and acceptance.   Dental advisory given  Plan Discussed with: CRNA, Anesthesiologist and Surgeon  Anesthesia Plan Comments:         Anesthesia Quick Evaluation

## 2012-10-21 NOTE — Progress Notes (Signed)
Patient ID: Grace Simpson, female   DOB: Apr 21, 1985, 27 y.o.   MRN: [id number] Per pt no changes in dictated H&P.  Brief exam WNL.

## 2012-10-21 NOTE — Brief Op Note (Signed)
10/21/2012  10:20 AM  PATIENT:  Grace Simpson  27 y.o. female  PRE-OPERATIVE DIAGNOSIS:  PREVIOUS CS  POST-OPERATIVE DIAGNOSIS:  previous cesarean section  PROCEDURE:  Procedure(s): REPEAT CESAREAN SECTION (N/A)  SURGEON:  Surgeon(s) and Role:    * Oliver Pila, MD - Primary    * Sherron Monday, MD - Assisting  ANESTHESIA:   spinal  EBL:  Total I/O In: 2300 [I.V.:2300] Out: 700 [Urine:100; Blood:600]  BLOOD ADMINISTERED:none  DRAINS: Urinary Catheter (Foley)   LOCAL MEDICATIONS USED:  NONE  SPECIMEN: Placenta  DISPOSITION OF SPECIMEN:  L&D  COUNTS:  YES  TOURNIQUET:  * No tourniquets in log *  DICTATION: .Dragon Dictation  PLAN OF CARE: Admit to inpatient   PATIENT DISPOSITION:  PACU - hemodynamically stable.

## 2012-10-22 LAB — CBC
HCT: 32.3 % — ABNORMAL LOW (ref 36.0–46.0)
Hemoglobin: 11.1 g/dL — ABNORMAL LOW (ref 12.0–15.0)
RBC: 3.6 MIL/uL — ABNORMAL LOW (ref 3.87–5.11)
WBC: 8 10*3/uL (ref 4.0–10.5)

## 2012-10-22 MED ORDER — RHO D IMMUNE GLOBULIN 1500 UNIT/2ML IJ SOLN
300.0000 ug | Freq: Once | INTRAMUSCULAR | Status: AC
  Administered 2012-10-22: 300 ug via INTRAMUSCULAR
  Filled 2012-10-22: qty 2

## 2012-10-22 NOTE — Anesthesia Postprocedure Evaluation (Signed)
  Anesthesia Post-op Note  Patient: Grace Simpson  Procedure(s) Performed: Procedure(s): REPEAT CESAREAN SECTION (N/A)  Patient Location: PACU  Anesthesia Type:Spinal  Level of Consciousness: awake, alert  and oriented  Airway and Oxygen Therapy: Patient Spontanous Breathing  Post-op Pain: none  Post-op Assessment: Post-op Vital signs reviewed, Patient's Cardiovascular Status Stable, Respiratory Function Stable, Patent Airway, No signs of Nausea or vomiting, Pain level controlled, No headache and No backache  Post-op Vital Signs: Reviewed and stable  Complications: No apparent anesthesia complications

## 2012-10-22 NOTE — Progress Notes (Signed)
Clinical Social Work Department PSYCHOSOCIAL ASSESSMENT - MATERNAL/CHILD 10/22/2012  Patient:  Grace Simpson,Grace Simpson  Account Number:  [id number]  Admit Date:  10/21/2012  Childs Name:   Grace Simpson    Clinical Social Worker:  Jeidy Hoerner, LCSW   Date/Time:  10/22/2012 04:10 PM  Date Referred:  10/21/2012   Referral source  Central Nursery     Referred reason  Depression/Anxiety   Other referral source:    I:  FAMILY / HOME ENVIRONMENT Child's legal guardian:    Guardian - Name Guardian - Age Guardian - Address  [name] [address] [place], [place] [address]  Allen Callanan  [address] [place], [place] [address]   Other household support members/support persons Name Relationship DOB  [name] SON 23 months   Other support:    II  PSYCHOSOCIAL DATA Information Source:  Patient Interview  Financial and Community Resources Employment:   Financial resources:  Medicaid If Medicaid - County:    School / Grade:   Maternity Care Coordinator / Child Services Coordination / Early Interventions:  Cultural issues impacting care:   none related    III  STRENGTHS Strengths  Adequate Resources  Home prepared for Child (including basic supplies)  Understanding of illness   Strength comment:    IV  RISK FACTORS AND CURRENT PROBLEMS Current Problem:  None   Risk Factor & Current Problem Patient Issue Family Issue Risk Factor / Current Problem Comment   N N     V  SOCIAL WORK ASSESSMENT Met with mother who was pleasant and receptive to social work intervention.  She is married and have one other dependent age 23 months.  Father is currently unemployed, but states he has a lead on a job.  Mother is receiving SSI, and maternal grandfather supports financially when needed.   Mother reports hx of anxiety and depression.   She reports last psychiatric hospitalization at age 16.  Mother states that she has been able to successfully manage her depression using outpatient services.   Informed  that she was taking hydroxyzine prior to pregnancy, but decided against medication during pregnancy.    She communicate intent to follow up with her psychiatrist to discuss getting re-started on medication.  She denies SI or HI.   She denies current symptoms of depression and anxiety.   No acute social concerns noted or reported at this time.  Good bonding noted with newborn.   Mother informed of social work availability.   VI SOCIAL WORK PLAN Social Work Plan  No Further Intervention Required / No Barriers to Discharge   Information/referral to community resources comment:   Mother selected Dr. Carey Williams for Pediatrician   [name], LCSW   

## 2012-10-22 NOTE — Progress Notes (Signed)
Subjective: Postpartum Day 1: Cesarean Delivery Patient reports incisional pain and tolerating PO.  Nl lochia, pain controlled  Objective: Vital signs in last 24 hours: Temp:  [97.2 F (36.2 C)-98.8 F (37.1 C)] 97.2 F (36.2 C) (09/06 0609) Pulse Rate:  [69-97] 90 (09/06 0609) Resp:  [16-52] 18 (09/06 0609) BP: (99-148)/(51-98) 118/51 mmHg (09/06 0609) SpO2:  [94 %-99 %] 98 % (09/06 0609) Weight:  [137.44 kg (303 lb)] 137.44 kg (303 lb) (09/05 1152)  Physical Exam:  General: alert and no distress Lochia: appropriate Uterine Fundus: firm Incision: healing well DVT Evaluation: No evidence of DVT seen on physical exam.   Recent Labs  10/20/12 1345 10/22/12 0610  HGB 13.6 11.1*  HCT 39.0 32.3*    Assessment/Plan: Status post Cesarean section. Doing well postoperatively.  Continue current care.  BOVARD,Breklyn Fabrizio 10/22/2012, 7:46 AM

## 2012-10-23 ENCOUNTER — Encounter (HOSPITAL_COMMUNITY): Payer: Self-pay | Admitting: Obstetrics and Gynecology

## 2012-10-23 DIAGNOSIS — Z98891 History of uterine scar from previous surgery: Secondary | ICD-10-CM

## 2012-10-23 HISTORY — DX: History of uterine scar from previous surgery: Z98.891

## 2012-10-23 LAB — TYPE AND SCREEN
ABO/RH(D): B NEG
Unit division: 0

## 2012-10-23 LAB — RH IG WORKUP (INCLUDES ABO/RH)
ABO/RH(D): B NEG
Fetal Screen: NEGATIVE
Gestational Age(Wks): 39.3

## 2012-10-23 MED ORDER — OXYCODONE-ACETAMINOPHEN 5-325 MG PO TABS: 1.0000 | ORAL_TABLET | Freq: Four times a day (QID) | ORAL | Status: DC | PRN

## 2012-10-23 MED ORDER — IBUPROFEN 800 MG PO TABS: 800.0000 mg | ORAL_TABLET | Freq: Three times a day (TID) | ORAL | Status: DC | PRN

## 2012-10-23 MED ORDER — PRENATAL MULTIVITAMIN CH: 1.0000 | ORAL_TABLET | Freq: Every day | ORAL | Status: DC

## 2012-10-23 NOTE — Progress Notes (Addendum)
Subjective: Postpartum Day 2: Cesarean Delivery Patient reports incisional pain and tolerating PO.  Nl lochia, pain controlled  Objective: Vital signs in last 24 hours: Temp:  [98.2 F (36.8 C)-98.6 F (37 C)] 98.2 F (36.8 C) (09/07 0627) Pulse Rate:  [93-106] 96 (09/07 0627) Resp:  [18] 18 (09/07 0627) BP: (119-137)/(74-84) 119/79 mmHg (09/07 0627) SpO2:  [97 %-98 %] 97 % (09/07 1610)  Physical Exam:  General: alert and no distress Lochia: appropriate Uterine Fundus: firm Incision: healing well DVT Evaluation: No evidence of DVT seen on physical exam.   Recent Labs  10/20/12 1345 10/22/12 0610  HGB 13.6 11.1*  HCT 39.0 32.3*    Assessment/Plan: Status post Cesarean section. Doing well postoperatively.  Continue current care. Pt desires d/c to home.  Peds OK's.  Will d/c with motrin/percocet/pnv. F/u 2 wks.    BOVARD,Chalise Pe 10/23/2012, 8:03 AM

## 2012-10-23 NOTE — Discharge Summary (Signed)
Obstetric Discharge Summary Reason for Admission: cesarean section Prenatal Procedures: none Intrapartum Procedures: cesarean section Postpartum Procedures: none Complications-Operative and Postpartum: none Hemoglobin  Date Value Range Status  10/22/2012 11.1* 12.0 - 15.0 g/dL Final     REPEATED TO VERIFY     DELTA CHECK NOTED     HCT  Date Value Range Status  10/22/2012 32.3* 36.0 - 46.0 % Final    Physical Exam:  General: alert and no distress Lochia: appropriate Uterine Fundus: firm Incision: healing well DVT Evaluation: No evidence of DVT seen on physical exam.  Discharge Diagnoses: Term Pregnancy-delivered  Discharge Information: Date: 10/23/2012 Activity: pelvic rest Diet: routine Medications: PNV, Ibuprofen and Percocet Condition: stable Instructions: refer to practice specific booklet Discharge to: home Follow-up Information   Follow up with Oliver Pila, MD. Schedule an appointment as soon as possible for a visit in 2 weeks.   Specialty:  Obstetrics and Gynecology   Contact information:   [address], [address] [place] [address] [phone]       Newborn Data: Live born female  Birth Weight: 6 lb 4 oz (2835 g) APGAR: 9, 9  Home with mother.  Grace Simpson,Grace Simpson 10/23/2012, 9:37 AM

## 2012-10-24 ENCOUNTER — Encounter (HOSPITAL_COMMUNITY): Payer: Self-pay | Admitting: Obstetrics and Gynecology

## 2012-10-24 NOTE — Progress Notes (Signed)
UR chart review completed.  

## 2012-11-09 ENCOUNTER — Ambulatory Visit (HOSPITAL_COMMUNITY): Admission: RE | Admit: 2012-11-09 | Payer: Medicaid Other | Source: Ambulatory Visit

## 2012-12-12 ENCOUNTER — Encounter (HOSPITAL_COMMUNITY): Payer: Self-pay | Admitting: Emergency Medicine

## 2012-12-12 ENCOUNTER — Emergency Department (HOSPITAL_COMMUNITY)
Admission: EM | Admit: 2012-12-12 | Discharge: 2012-12-13 | Disposition: A | Discharge status: Home / Self Care | Payer: Medicaid Other | Attending: Emergency Medicine | Admitting: Emergency Medicine

## 2012-12-12 DIAGNOSIS — Z8619 Personal history of other infectious and parasitic diseases: Secondary | ICD-10-CM | POA: Insufficient documentation

## 2012-12-12 DIAGNOSIS — Z872 Personal history of diseases of the skin and subcutaneous tissue: Secondary | ICD-10-CM | POA: Insufficient documentation

## 2012-12-12 DIAGNOSIS — Z87440 Personal history of urinary (tract) infections: Secondary | ICD-10-CM | POA: Insufficient documentation

## 2012-12-12 DIAGNOSIS — Z8669 Personal history of other diseases of the nervous system and sense organs: Secondary | ICD-10-CM | POA: Insufficient documentation

## 2012-12-12 DIAGNOSIS — I10 Essential (primary) hypertension: Secondary | ICD-10-CM | POA: Insufficient documentation

## 2012-12-12 DIAGNOSIS — R209 Unspecified disturbances of skin sensation: Secondary | ICD-10-CM | POA: Insufficient documentation

## 2012-12-12 DIAGNOSIS — Z8742 Personal history of other diseases of the female genital tract: Secondary | ICD-10-CM | POA: Insufficient documentation

## 2012-12-12 DIAGNOSIS — Z7982 Long term (current) use of aspirin: Secondary | ICD-10-CM | POA: Insufficient documentation

## 2012-12-12 DIAGNOSIS — R51 Headache: Secondary | ICD-10-CM | POA: Insufficient documentation

## 2012-12-12 DIAGNOSIS — Z8719 Personal history of other diseases of the digestive system: Secondary | ICD-10-CM | POA: Insufficient documentation

## 2012-12-12 DIAGNOSIS — Z8659 Personal history of other mental and behavioral disorders: Secondary | ICD-10-CM | POA: Insufficient documentation

## 2012-12-12 DIAGNOSIS — F17200 Nicotine dependence, unspecified, uncomplicated: Secondary | ICD-10-CM | POA: Insufficient documentation

## 2012-12-12 DIAGNOSIS — E039 Hypothyroidism, unspecified: Secondary | ICD-10-CM | POA: Insufficient documentation

## 2012-12-12 NOTE — ED Notes (Signed)
Pt states she has been having headaches off and on for the past week   Pt states over the past couple of days she has been having visual disturbances, the left side of her mouth and tongue go numb, and she will be talking and not be able to communicate her thoughts into words

## 2012-12-13 ENCOUNTER — Emergency Department (HOSPITAL_COMMUNITY): Payer: Medicaid Other

## 2012-12-13 ENCOUNTER — Encounter (HOSPITAL_COMMUNITY): Payer: Self-pay

## 2012-12-13 LAB — POCT I-STAT, CHEM 8
BUN: <3 mg/dL — ABNORMAL LOW (ref 6–23)
Calcium, Ion: 1.29 mmol/L — ABNORMAL HIGH (ref 1.12–1.23)
Creatinine, Ser: 1 mg/dL (ref 0.50–1.10)
TCO2: 25 mmol/L (ref 0–100)

## 2012-12-13 MED ORDER — IBUPROFEN 600 MG PO TABS: 600.0000 mg | ORAL_TABLET | Freq: Three times a day (TID) | ORAL | Status: DC | PRN

## 2012-12-13 MED ORDER — ASPIRIN 81 MG PO CHEW: 81.0000 mg | CHEWABLE_TABLET | Freq: Every day | ORAL | Status: DC

## 2012-12-13 MED ORDER — IOHEXOL 350 MG/ML SOLN
100.0000 mL | Freq: Once | INTRAVENOUS | Status: AC | PRN
  Administered 2012-12-13: 100 mL via INTRAVENOUS

## 2012-12-13 MED ORDER — METOCLOPRAMIDE HCL 5 MG/ML IJ SOLN
10.0000 mg | Freq: Once | INTRAMUSCULAR | Status: AC
  Administered 2012-12-13: 10 mg via INTRAVENOUS
  Filled 2012-12-13: qty 2

## 2012-12-13 MED ORDER — MORPHINE SULFATE 4 MG/ML IJ SOLN
4.0000 mg | Freq: Once | INTRAMUSCULAR | Status: AC
  Administered 2012-12-13: 4 mg via INTRAVENOUS
  Filled 2012-12-13: qty 1

## 2012-12-13 NOTE — ED Provider Notes (Signed)
CSN: [id number]     Arrival date & time 12/12/12  2321 History   First MD Initiated Contact with Patient 12/12/12 2338     Chief Complaint  Patient presents with  . Headache    The history is provided by the patient.   patient is currently 7 weeks postpartum from a C-section.  She has a normal healthy young daughter.  She states intermittent left-sided headaches over the past 7-10 days.  States no significant changes in her vision but does report intermittent issues with the left side of her tongue going numb and intermittent issues with speech and what she describes as intermittent confusion.  No history of blood clots.  No prior history of headaches.  She denies photophobia.  She reports no sinus symptoms at this time.  She denies fevers and chills.  No neck pain or neck stiffness.  Her headache seems to be more intermittent in her symptoms in terms of her neurologic complaints does seem to always be related to the severity of her headache.  Past Medical History  Diagnosis Date  . Hypothyroidism   . Obesity, morbid (more than 100 lbs over ideal weight or BMI > 40)   . Bipolar affective   . OCD (obsessive compulsive disorder)   . Oppositional defiant disorder   . Herpes     last outbreak 4 months ago  . Hyperprolactinemia   . GERD (gastroesophageal reflux disease)   . Left breast abscess     not MRSA  . UTI (lower urinary tract infection)   . Trichomonas   . BV (bacterial vaginosis)   . Hemorrhoids   . Hypertension   . Pregnancy induced hypertension     unsure if PIH or CHTN - no meds  . Anxiety     no meds currently  . Obstructive sleep apnea     does not use CPAP  . Seasonal allergies   . Pyelonephritis     history - no current problem  . S/P cesarean section 10/23/2012   Past Surgical History  Procedure Laterality Date  . Tonsillectomy    . Left breast abcess    . Cholecystectomy    . Adenoidectomy    . Cesarean section  11/20/2010    Procedure: CESAREAN SECTION;   Surgeon: Oliver Pila;  Location: [facility];  Service: Gynecology;  Laterality: N/A;  . Wisdom tooth extraction    . Cesarean section N/A 10/21/2012    Procedure: REPEAT CESAREAN SECTION;  Surgeon: Oliver Pila, MD;  Location: [facility];  Service: Obstetrics;  Laterality: N/A;   Family History  Problem Relation Age of Onset  . Adopted: Yes   History  Substance Use Topics  . Smoking status: Current Every Day Smoker -- 0.50 packs/day    Types: Cigarettes    Last Attempt to Quit: 03/01/2012  . Smokeless tobacco: Never Used  . Alcohol Use: No   OB History   Grav Para Term Preterm Abortions TAB SAB Ect Mult Living   2 2 2  0 0 0 0 0 0 2     Review of Systems  Neurological: Positive for headaches.  All other systems reviewed and are negative.    Allergies  Corticosteroids and Diphenhydramine hcl  Home Medications   Current Outpatient Rx  Name  Route  Sig  Dispense  Refill  . ibuprofen (ADVIL,MOTRIN) 800 MG tablet   Oral   Take 1 tablet (800 mg total) by mouth every 8 (eight) hours as needed for pain.  45 tablet   1   . levothyroxine (SYNTHROID, LEVOTHROID) 112 MCG tablet   Oral   Take 112 mcg by mouth every morning.          Marland Kitchen aspirin 81 MG chewable tablet   Oral   Chew 1 tablet (81 mg total) by mouth daily.   30 tablet   0   . ibuprofen (ADVIL,MOTRIN) 600 MG tablet   Oral   Take 1 tablet (600 mg total) by mouth every 8 (eight) hours as needed for pain.   15 tablet   0    BP 149/79  Pulse 99  Temp(Src) 98.4 F (36.9 C) (Oral)  Resp 18  SpO2 99%  LMP 12/04/2012 Physical Exam  Nursing note and vitals reviewed. Constitutional: She is oriented to person, place, and time. She appears well-developed and well-nourished. No distress.  HENT:  Head: Normocephalic and atraumatic.  Eyes: EOM are normal. Pupils are equal, round, and reactive to light.  Neck: Normal range of motion.  Cardiovascular: Normal rate, regular rhythm and normal heart sounds.    Pulmonary/Chest: Effort normal and breath sounds normal.  Abdominal: Soft. She exhibits no distension. There is no tenderness.  Musculoskeletal: Normal range of motion.  Neurological: She is alert and oriented to person, place, and time.  5/5 strength in major muscle groups of  bilateral upper and lower extremities. Speech normal. No facial asymetry.   Skin: Skin is warm and dry.  Psychiatric: She has a normal mood and affect. Judgment normal.    ED Course  Procedures (including critical care time) Labs Review Labs Reviewed  POCT I-STAT, CHEM 8 - Abnormal; Notable for the following:    Potassium 3.2 (*)    BUN <3 (*)    Glucose, Bld 103 (*)    Calcium, Ion 1.29 (*)    All other components within normal limits   Imaging Review Ct Angio Head W/cm &/or Wo Cm  12/13/2012   CLINICAL DATA:  Headache. Postpartum. Speech difficulty. Rule out venous sinus thrombosis.  EXAM: CT ANGIOGRAPHY HEAD  TECHNIQUE: Multidetector CT imaging of the head was performed using the standard protocol during bolus administration of intravenous contrast. Multiplanar CT image reconstructions including MIPs were obtained to evaluate the vascular anatomy.  CONTRAST:  OMNIPAQUE IOHEXOL 350 MG/ML SOLN  COMPARISON:  07/28/2008 brain MRI  FINDINGS: There is venous contamination which limits assessment of the arterial structures, there is useful for evaluating the dural venous sinuses.  No acute arterial abnormality, such is evidence of high-grade stenosis or evident vasculitis. No evidence of aneurysm.  Deep and superficial vein structures are patent, with no evidence of acute thrombosis. There is however narrowing of the bilateral transverse sinuses, which is dominant on the right. This appearance is similar to 07/28/2008 brain MRI. Multiples small veins follow the tentorium, likely bypassing these stenoses. There are no definitive signs of increased intracranial pressure: no imaging equivalent of papilledema, no empty  sella appearance, no pseudomeningocele formation.  No acute noncontrast abnormality, such is acute infarct, hemorrhage, hydrocephalus, or evident mass lesion.  Review of the MIP images confirms the above findings.  IMPRESSION: 1. No acute intracranial venous or arterial abnormality. 2. 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 This finding could be clinically relevant if there is history of chronic headaches, vision changes, or increased intracranial pressure.   Electronically Signed   By: Tiburcio Pea M.D.   On: 12/13/2012 01:48  I personally reviewed the imaging tests through PACS system I reviewed available  ER/hospitalization records through the EMR   EKG Interpretation   None       MDM   1. Headache    Patient wanted a CT angiogram of her head to evaluate for venous sinus thrombosis.  My suspicion however is low this is likely more atypical migraine.  My suspicion for TIA and stroke is low given her age of 6 and her lack of risk factors  3:39 AM Patient feels much better at this time.  Discharge home in good condition.  Neurology follow    Lyanne Co, MD 12/13/12 [phone]

## 2012-12-13 NOTE — ED Notes (Signed)
Pt reporting numbness to L side of mouth and face, MD made aware. Pt aware waiting for CT at this time

## 2012-12-20 ENCOUNTER — Ambulatory Visit (INDEPENDENT_AMBULATORY_CARE_PROVIDER_SITE_OTHER): Payer: Medicaid Other | Admitting: Neurology

## 2012-12-20 ENCOUNTER — Encounter: Payer: Self-pay | Admitting: Neurology

## 2012-12-20 VITALS — BP 139/87 | HR 79 | Temp 98.8°F | Ht 61.5 in | Wt 283.0 lb

## 2012-12-20 DIAGNOSIS — H538 Other visual disturbances: Secondary | ICD-10-CM

## 2012-12-20 DIAGNOSIS — G4733 Obstructive sleep apnea (adult) (pediatric): Secondary | ICD-10-CM

## 2012-12-20 DIAGNOSIS — Z98891 History of uterine scar from previous surgery: Secondary | ICD-10-CM

## 2012-12-20 DIAGNOSIS — R51 Headache: Secondary | ICD-10-CM

## 2012-12-20 DIAGNOSIS — Z9889 Other specified postprocedural states: Secondary | ICD-10-CM

## 2012-12-20 DIAGNOSIS — R42 Dizziness and giddiness: Secondary | ICD-10-CM

## 2012-12-20 NOTE — Patient Instructions (Signed)
Please remember, common headache triggers are: sleep deprivation, dehydration, overheating, stress, hypoglycemia or skipping meals and blood sugar fluctuations, excessive pain medications or excessive alcohol use or caffeine withdrawal. Some people have food triggers such as aged cheese, orange juice or chocolate, especially dark chocolate, or MSG (monosodium glutamate). Try to avoid these headache triggers as much possible. It may be helpful to keep a headache diary to figure out what makes your headaches worse or brings them on and what alleviates them. Some people report headache onset after exercise but studies have shown that regular exercise may actually prevent headaches from coming. If you have exercise-induced headaches, please make sure that you drink plenty of fluid before and after exercising and that you do not over do it and do not overheat.  Based on your symptoms and your exam I believe you are at risk for obstructive sleep apnea or OSA, and I think we should proceed with a sleep study to determine whether you do or do not have OSA and how severe it is. If you have more than mild OSA, I want you to consider treatment with CPAP. Please remember, the risks and ramifications of moderate to severe obstructive sleep apnea or OSA are: Cardiovascular disease, including congestive heart failure, stroke, difficult to control hypertension, arrhythmias, and even type 2 diabetes has been linked to untreated OSA. Sleep apnea causes disruption of sleep and sleep deprivation in most cases, which, in turn, can cause recurrent headaches, problems with memory, mood, concentration, focus, and vigilance. Most people with untreated sleep apnea report excessive daytime sleepiness, which can affect their ability to drive. Please do not drive if you feel sleepy.  I will see you back after your sleep study to go over the test results and where to go from there. We will call you after your sleep study and to set up an  appointment at the time.   

## 2012-12-20 NOTE — Progress Notes (Signed)
Subjective:    Patient ID: Grace Simpson is a 27 y.o. female.  HPI  Huston Foley, MD, PhD [facility] [address], [address] [address] [place], [place] [address]   Dear Dr. Patria Mane,   I saw Grace Simpson upon your kind request in my neurologic clinic today for initial consultation of her headaches. The patient is unaccompanied today. As you know, Grace Simpson is a 27 year old right-handed woman with an underlying medical history of obesity, hypothyroidism, bipolar disorder, OCD, ODD, reflux disease, prior UTI, hypertension, anxiety, OSA, not on CPAP, history of pyelonephritis, and seasonal allergies, who recently presented to the emergency room on 12/12/2012 with a complaint of new onset headache. She is recently postpartum from a C-section about 8 weeks ago. She presented with a intermittent left-sided headache ongoing for palate 7-10 days with intermittent issues including left tongue numbness and speech impairment as well as intermittent confusion. She has no history of blood clots and no prior history of headaches. She denied photophobia upon presentation and no prodromal illness. She had a CT angiogram head on 12/13/2012 and I reviewed the report: No acute intracranial venous or arterial abnormality, chronic narrowing of the bilateral transverse sinuses, this finding could be clinically relevant if there is history of chronic headaches, vision changes or increased intracranial pressure. She was discharged from the emergency room on 12/13/2012 after reporting that she felt better. She had no focal neurologic finding at the time.  She had a brain MRI w/wo contrast in 6/10: Negative cranial MRI with special attention to the pituitary.  She reports a bilateral headache, which is described as constant aching in bifrontal area. No N/V/photo or sonophobia.  There is no family history of migraine.  Treatments tried include OTC NSAIDs.  The patient denies prior TIA or stroke  symptoms, such as sudden onset of one sided weakness, numbness, tingling, slurring of speech or droopy face, hearing loss, tinnitus, diplopia or visual field cut or monocular loss of vision. She does have occasional blurry vision and no visual obscuration with bending down or with bearing down, and pain is not positional.  She has had intermittent vertigo and currently has no HA, but mild vertigo, no nausea.   Her Past Medical History Is Significant For: Past Medical History  Diagnosis Date  . Hypothyroidism   . Obesity, morbid (more than 100 lbs over ideal weight or BMI > 40)   . Bipolar affective   . OCD (obsessive compulsive disorder)   . Oppositional defiant disorder   . Herpes     last outbreak 4 months ago  . Hyperprolactinemia   . GERD (gastroesophageal reflux disease)   . Left breast abscess     not MRSA  . UTI (lower urinary tract infection)   . Trichomonas   . BV (bacterial vaginosis)   . Hemorrhoids   . Hypertension   . Pregnancy induced hypertension     unsure if PIH or CHTN - no meds  . Anxiety     no meds currently  . Obstructive sleep apnea     does not use CPAP  . Seasonal allergies   . Pyelonephritis     history - no current problem  . S/P cesarean section 10/23/2012    Her Past Surgical History Is Significant For: Past Surgical History  Procedure Laterality Date  . Tonsillectomy    . Left breast abcess    . Cholecystectomy    . Adenoidectomy    . Cesarean section  11/20/2010  Procedure: CESAREAN SECTION;  Surgeon: Oliver Pila;  Location: [facility];  Service: Gynecology;  Laterality: N/A;  . Wisdom tooth extraction    . Cesarean section N/A 10/21/2012    Procedure: REPEAT CESAREAN SECTION;  Surgeon: Oliver Pila, MD;  Location: [facility];  Service: Obstetrics;  Laterality: N/A;    Her Family History Is Significant For: Family History  Problem Relation Age of Onset  . Adopted: Yes    Her Social History Is Significant For: History   Social  History  . Marital Status: Married    Spouse Name: N/A    Number of Children: N/A  . Years of Education: N/A   Social History Main Topics  . Smoking status: Current Every Day Smoker -- 0.50 packs/day    Types: Cigarettes    Last Attempt to Quit: 03/01/2012  . Smokeless tobacco: Never Used  . Alcohol Use: No  . Drug Use: No  . Sexual Activity: Not Currently    Birth Control/ Protection: None     Comment: pregnant   Other Topics Concern  . None   Social History Narrative  . None    Her Allergies Are:  Allergies  Allergen Reactions  . Corticosteroids Other (See Comments)    Adverse effect  . Diphenhydramine Hcl Other (See Comments)    Adverse effect  :   Her Current Medications Are:  Outpatient Encounter Prescriptions as of 12/20/2012  Medication Sig  . aspirin 81 MG chewable tablet Chew 1 tablet (81 mg total) by mouth daily.  Marland Kitchen ibuprofen (ADVIL,MOTRIN) 600 MG tablet Take 1 tablet (600 mg total) by mouth every 8 (eight) hours as needed for pain.  Marland Kitchen ibuprofen (ADVIL,MOTRIN) 800 MG tablet Take 1 tablet (800 mg total) by mouth every 8 (eight) hours as needed for pain.  Marland Kitchen levothyroxine (SYNTHROID, LEVOTHROID) 112 MCG tablet Take 112 mcg by mouth every morning.   :  Review of Systems:  Out of a complete 14 point review of systems, all are reviewed and negative with the exception of these symptoms as listed below:   Review of Systems  Constitutional: Positive for chills, appetite change, fatigue and unexpected weight change.  Eyes: Visual disturbance: blurred vision - mainly OD.  Respiratory: Positive for shortness of breath.   Cardiovascular: Negative.   Gastrointestinal: Negative.   Endocrine: Positive for cold intolerance and polydipsia.  Genitourinary: Negative.   Musculoskeletal: Negative.   Skin: Negative.   Allergic/Immunologic: Positive for environmental allergies.  Neurological: Positive for dizziness, weakness and headaches.       Memory loss  Hematological:  Negative.   Psychiatric/Behavioral: Positive for confusion and sleep disturbance.    Objective:  Neurologic Exam  Physical Exam Physical Examination:   Filed Vitals:   12/20/12 1426  BP: 139/87  Pulse: 79  Temp: 98.8 F (37.1 C)    General Examination: The patient is a very pleasant 27 y.o. female in no acute distress. She appears well-developed and well-nourished and adequately groomed. She is obese.  HEENT: Normocephalic, atraumatic, pupils are equal, round and reactive to light and accommodation. Funduscopic exam is normal with sharp disc margins noted. Extraocular tracking is good without limitation to gaze excursion or nystagmus noted. Visual fields are full by finger perimetry.  Normal smooth pursuit is noted. Hearing is grossly intact. Tympanic membranes are clear bilaterally. Face is symmetric with normal facial animation and normal facial sensation. Speech is clear with no dysarthria noted. There is no hypophonia. There is no lip, neck/head, jaw or voice  tremor. Neck is supple with full range of passive and active motion. There are no carotid bruits on auscultation. Oropharynx exam reveals: mild mouth dryness, adequate dental hygiene and moderate airway crowding, due to narrow airway entry and larger tongue. Mallampati is class II. Tongue protrudes centrally and palate elevates symmetrically. Tonsils are absent. Neck size is 16 inches.   Chest: Clear to auscultation without wheezing, rhonchi or crackles noted.  Heart: S1+S2+0, regular and normal without murmurs, rubs or gallops noted.   Abdomen: Soft, non-tender and non-distended with normal bowel sounds appreciated on auscultation.  Extremities: There is no pitting edema in the distal lower extremities bilaterally. Pedal pulses are intact.  Skin: Warm and dry without trophic changes noted. There are no varicose veins.  Musculoskeletal: exam reveals no obvious joint deformities, tenderness or joint swelling or erythema.    Neurologically:  Mental status: The patient is awake, alert and oriented in all 4 spheres. Her memory, attention, language and knowledge are appropriate. There is no aphasia, agnosia, apraxia or anomia. Speech is clear with normal prosody and enunciation. Thought process is linear. Mood is congruent and affect is normal.  Cranial nerves are as described above under HEENT exam. In addition, shoulder shrug is normal with equal shoulder height noted. Motor exam: Normal bulk, strength and tone is noted. There is no drift, tremor or rebound. Romberg is negative. Reflexes are 2+ throughout. Toes are downgoing bilaterally. Fine motor skills are intact with normal finger taps, normal hand movements, normal rapid alternating patting, normal foot taps and normal foot agility.  Cerebellar testing shows no dysmetria or intention tremor on finger to nose testing. Heel to shin is unremarkable bilaterally. There is no truncal or gait ataxia.  Sensory exam is intact to light touch, pinprick, vibration, temperature sense and proprioception in the upper and lower extremities.  Gait, station and balance are unremarkable. No veering to one side is noted. No leaning to one side is noted. Posture is age-appropriate and stance is narrow based. No problems turning are noted. She turns en bloc. Tandem walk is unremarkable. Intact toe and heel stance is noted.               Assessment and Plan:   In summary, Grace Simpson is a very pleasant 27 y.o.-year old female with a history of recent onset of recurrent headaches. Her physical exam is nonfocal with the exception of obesity. I reassured her that she does not have any focal neurological finding. Nevertheless, she has had intermittent vertigo. She has also had some intermittent blurry vision. She has recently had a baby. I would like to proceed with MRI of brain and also repeat a sleep study because she has been diagnosed with sleep apnea in the past but is currently not using  CPAP. She is advised that her headache is probably not migrainous. She does not have any papilledema. Nevertheless, she may be at risk for intracranial hypertension. She recently had an eye exam. I did not suggest any new medication at this time and suggested that she continue with as needed over-the-counter medication. We talked about potential headache triggers at length today. This may be a HA related to stress and sleep deprivation, she also has had some erratic meal times. We talked about medical treatments and non-pharmacological approaches. We talked about maintaining a healthy lifestyle in general. I encouraged the patient to eat healthy, exercise daily and keep well hydrated, to keep a scheduled bedtime and wake time routine, to not skip any meals  and eat healthy snacks in between meals and to have protein with every meal.   As far as further diagnostic testing is concerned, I suggested the following today: MRI brain wo contrast and sleep study.  I answered all her questions today and the patient was in agreement with the above outlined plan. I would like to see the patient back soon, after the tests are completed. She is advised to call with any interim questions, concerns, problems or updates. Thank you very much for allowing me to participate in the care of this nice patient. If I can be of any further assistance to you please do not hesitate to call me at [phone].  Sincerely,   Huston Foley, MD, PhD

## 2012-12-22 ENCOUNTER — Other Ambulatory Visit: Payer: Self-pay

## 2013-01-03 ENCOUNTER — Ambulatory Visit
Admission: RE | Admit: 2013-01-03 | Discharge: 2013-01-03 | Disposition: A | Discharge status: Home / Self Care | Payer: Medicaid Other | Source: Ambulatory Visit | Attending: Neurology | Admitting: Neurology

## 2013-01-03 DIAGNOSIS — G4733 Obstructive sleep apnea (adult) (pediatric): Secondary | ICD-10-CM

## 2013-01-03 DIAGNOSIS — R51 Headache: Secondary | ICD-10-CM

## 2013-01-03 DIAGNOSIS — Z98891 History of uterine scar from previous surgery: Secondary | ICD-10-CM

## 2013-01-09 NOTE — Progress Notes (Signed)
Quick Note:  Please call and advise the patient that the recent scan we did was within normal limits. We did a brain MRI without contrast and it was reported as normal. Please remind patient to keep any upcoming appointments or tests and to call us with any interim questions, concerns, problems or updates. Thanks,  Huston Foley, MD, PhD   ______

## 2013-02-20 ENCOUNTER — Telehealth: Payer: Self-pay | Admitting: Neurology

## 2013-02-20 ENCOUNTER — Encounter: Payer: Self-pay | Admitting: Neurology

## 2013-02-20 NOTE — Telephone Encounter (Signed)
Pt requested to be rescheduled for her sleep study on 02/18/2013.  She did not show for this appointment nor called to cancel.  Will notify the referring provider and place the patients chart in follow up until further notice.

## 2013-04-18 ENCOUNTER — Encounter (HOSPITAL_COMMUNITY): Payer: Self-pay | Admitting: Emergency Medicine

## 2013-04-18 ENCOUNTER — Emergency Department (HOSPITAL_COMMUNITY)
Admission: EM | Admit: 2013-04-18 | Discharge: 2013-04-18 | Disposition: A | Discharge status: Home / Self Care | Payer: Medicaid Other | Attending: Emergency Medicine | Admitting: Emergency Medicine

## 2013-04-18 ENCOUNTER — Emergency Department (HOSPITAL_COMMUNITY): Payer: Medicaid Other

## 2013-04-18 DIAGNOSIS — R0602 Shortness of breath: Secondary | ICD-10-CM | POA: Insufficient documentation

## 2013-04-18 DIAGNOSIS — E039 Hypothyroidism, unspecified: Secondary | ICD-10-CM | POA: Insufficient documentation

## 2013-04-18 DIAGNOSIS — Z87448 Personal history of other diseases of urinary system: Secondary | ICD-10-CM | POA: Insufficient documentation

## 2013-04-18 DIAGNOSIS — R209 Unspecified disturbances of skin sensation: Secondary | ICD-10-CM | POA: Insufficient documentation

## 2013-04-18 DIAGNOSIS — Z8719 Personal history of other diseases of the digestive system: Secondary | ICD-10-CM | POA: Insufficient documentation

## 2013-04-18 DIAGNOSIS — Z8619 Personal history of other infectious and parasitic diseases: Secondary | ICD-10-CM | POA: Insufficient documentation

## 2013-04-18 DIAGNOSIS — R0789 Other chest pain: Secondary | ICD-10-CM | POA: Insufficient documentation

## 2013-04-18 DIAGNOSIS — F319 Bipolar disorder, unspecified: Secondary | ICD-10-CM | POA: Insufficient documentation

## 2013-04-18 DIAGNOSIS — Z8742 Personal history of other diseases of the female genital tract: Secondary | ICD-10-CM | POA: Insufficient documentation

## 2013-04-18 DIAGNOSIS — R61 Generalized hyperhidrosis: Secondary | ICD-10-CM | POA: Insufficient documentation

## 2013-04-18 DIAGNOSIS — M79609 Pain in unspecified limb: Secondary | ICD-10-CM | POA: Insufficient documentation

## 2013-04-18 DIAGNOSIS — F429 Obsessive-compulsive disorder, unspecified: Secondary | ICD-10-CM | POA: Insufficient documentation

## 2013-04-18 DIAGNOSIS — Z79899 Other long term (current) drug therapy: Secondary | ICD-10-CM | POA: Insufficient documentation

## 2013-04-18 DIAGNOSIS — Z3202 Encounter for pregnancy test, result negative: Secondary | ICD-10-CM | POA: Insufficient documentation

## 2013-04-18 DIAGNOSIS — Z87440 Personal history of urinary (tract) infections: Secondary | ICD-10-CM | POA: Insufficient documentation

## 2013-04-18 DIAGNOSIS — R6883 Chills (without fever): Secondary | ICD-10-CM | POA: Insufficient documentation

## 2013-04-18 DIAGNOSIS — F411 Generalized anxiety disorder: Secondary | ICD-10-CM | POA: Insufficient documentation

## 2013-04-18 DIAGNOSIS — I10 Essential (primary) hypertension: Secondary | ICD-10-CM | POA: Insufficient documentation

## 2013-04-18 DIAGNOSIS — F17200 Nicotine dependence, unspecified, uncomplicated: Secondary | ICD-10-CM | POA: Insufficient documentation

## 2013-04-18 DIAGNOSIS — R079 Chest pain, unspecified: Secondary | ICD-10-CM

## 2013-04-18 LAB — CBC
HEMATOCRIT: 40.5 % (ref 36.0–46.0)
HEMOGLOBIN: 13.8 g/dL (ref 12.0–15.0)
MCH: 29.5 pg (ref 26.0–34.0)
MCHC: 34.1 g/dL (ref 30.0–36.0)
MCV: 86.5 fL (ref 78.0–100.0)
Platelets: 299 10*3/uL (ref 150–400)
RBC: 4.68 MIL/uL (ref 3.87–5.11)
RDW: 14.4 % (ref 11.5–15.5)
WBC: 9.4 10*3/uL (ref 4.0–10.5)

## 2013-04-18 LAB — I-STAT TROPONIN, ED: Troponin i, poc: 0 ng/mL (ref 0.00–0.08)

## 2013-04-18 LAB — BASIC METABOLIC PANEL
BUN: 7 mg/dL (ref 6–23)
CALCIUM: 9.9 mg/dL (ref 8.4–10.5)
CO2: 25 mEq/L (ref 19–32)
Chloride: 103 mEq/L (ref 96–112)
Creatinine, Ser: 0.74 mg/dL (ref 0.50–1.10)
GLUCOSE: 93 mg/dL (ref 70–99)
Potassium: 3.8 mEq/L (ref 3.7–5.3)
Sodium: 140 mEq/L (ref 137–147)

## 2013-04-18 LAB — POC URINE PREG, ED: Preg Test, Ur: NEGATIVE

## 2013-04-18 LAB — D-DIMER, QUANTITATIVE: D-Dimer, Quant: <0.27 ug/mL-FEU (ref 0.00–0.48)

## 2013-04-18 MED ORDER — KETOROLAC TROMETHAMINE 30 MG/ML IJ SOLN
30.0000 mg | Freq: Once | INTRAMUSCULAR | Status: AC
  Administered 2013-04-18: 30 mg via INTRAVENOUS
  Filled 2013-04-18: qty 1

## 2013-04-18 MED ORDER — TRAMADOL HCL 50 MG PO TABS: 50.0000 mg | ORAL_TABLET | Freq: Four times a day (QID) | ORAL | Status: DC | PRN

## 2013-04-18 MED ORDER — KETOROLAC TROMETHAMINE 60 MG/2ML IM SOLN: 60.0000 mg | Freq: Once | INTRAMUSCULAR | Status: DC

## 2013-04-18 NOTE — Discharge Instructions (Signed)
Chest Pain (Nonspecific) °It is often hard to give a specific diagnosis for the cause of chest pain. There is always a chance that your pain could be related to something serious, such as a heart attack or a blood clot in the lungs. You need to follow up with your caregiver for further evaluation. °CAUSES  °· Heartburn. °· Pneumonia or bronchitis. °· Anxiety or stress. °· Inflammation around your heart (pericarditis) or lung (pleuritis or pleurisy). °· A blood clot in the lung. °· A collapsed lung (pneumothorax). It can develop suddenly on its own (spontaneous pneumothorax) or from injury (trauma) to the chest. °· Shingles infection (herpes zoster virus). °The chest wall is composed of bones, muscles, and cartilage. Any of these can be the source of the pain. °· The bones can be bruised by injury. °· The muscles or cartilage can be strained by coughing or overwork. °· The cartilage can be affected by inflammation and become sore (costochondritis). °DIAGNOSIS  °Lab tests or other studies, such as X-rays, electrocardiography, stress testing, or cardiac imaging, may be needed to find the cause of your pain.  °TREATMENT  °· Treatment depends on what may be causing your chest pain. Treatment may include: °· Acid blockers for heartburn. °· Anti-inflammatory medicine. °· Pain medicine for inflammatory conditions. °· Antibiotics if an infection is present. °· You may be advised to change lifestyle habits. This includes stopping smoking and avoiding alcohol, caffeine, and chocolate. °· You may be advised to keep your head raised (elevated) when sleeping. This reduces the chance of acid going backward from your stomach into your esophagus. °· Most of the time, nonspecific chest pain will improve within 2 to 3 days with rest and mild pain medicine. °HOME CARE INSTRUCTIONS  °· If antibiotics were prescribed, take your antibiotics as directed. Finish them even if you start to feel better. °· For the next few days, avoid physical  activities that bring on chest pain. Continue physical activities as directed. °· Do not smoke. °· Avoid drinking alcohol. °· Only take over-the-counter or prescription medicine for pain, discomfort, or fever as directed by your caregiver. °· Follow your caregiver's suggestions for further testing if your chest pain does not go away. °· Keep any follow-up appointments you made. If you do not go to an appointment, you could develop lasting (chronic) problems with pain. If there is any problem keeping an appointment, you must call to reschedule. °SEEK MEDICAL CARE IF:  °· You think you are having problems from the medicine you are taking. Read your medicine instructions carefully. °· Your chest pain does not go away, even after treatment. °· You develop a rash with blisters on your chest. °SEEK IMMEDIATE MEDICAL CARE IF:  °· You have increased chest pain or pain that spreads to your arm, neck, jaw, back, or abdomen. °· You develop shortness of breath, an increasing cough, or you are coughing up blood. °· You have severe back or abdominal pain, feel nauseous, or vomit. °· You develop severe weakness, fainting, or chills. °· You have a fever. °THIS IS AN EMERGENCY. Do not wait to see if the pain will go away. Get medical help at once. Call your local emergency services (911 in [place] Do not drive yourself to the hospital. °MAKE SURE YOU:  °· Understand these instructions. °· Will watch your condition. °· Will get help right away if you are not doing well or get worse. °Document Released: 11/12/2004 Document Revised: 04/27/2011 Document Reviewed: 09/08/2007 °Exit[employer]® Patient Information ©2014 [employer]   [employer]. ° °

## 2013-04-18 NOTE — ED Provider Notes (Signed)
CSN: [id number]     Arrival date & time 04/18/13  1925 History   First MD Initiated Contact with Patient 04/18/13 2012     Chief Complaint  Patient presents with  . Chest Pain  . Shortness of Breath     (Consider location/radiation/quality/duration/timing/severity/associated sxs/prior Treatment) HPI Pt is a 27yo morbidly obese female with hx of hypothyroidism, bipolar disorder, OCD, anxiety and OSA c/o chest pain associated with SOB x2 days. Reports pain is intermittent, sharp in nature, lasts a few minutes at a time, radiates down left arm and causes left arm numbness, 4/10. Reports hx of similar symptoms last year with negative echocardiogram but states chest pain is worse this time.  Also reports intermittent lightheadedness but denies currently.  Chest pain not relieved by baby aspirin taken yesterday, antacids, or anti-gas medication. Food does not make pain better or worse. Reports intermittent hot and cold chills.  Denies fever, vomiting or diarrhea. No hx of PE or DVT. No long car or plane flights. Pt is on birth control. Denies leg pain or swelling. Denies palpitations. Denies hx of CAD.  Past Medical History  Diagnosis Date  . Hypothyroidism   . Obesity, morbid (more than 100 lbs over ideal weight or BMI > 40)   . Bipolar affective   . OCD (obsessive compulsive disorder)   . Oppositional defiant disorder   . Herpes     last outbreak 4 months ago  . Hyperprolactinemia   . GERD (gastroesophageal reflux disease)   . Left breast abscess     not MRSA  . UTI (lower urinary tract infection)   . Trichomonas   . BV (bacterial vaginosis)   . Hemorrhoids   . Hypertension   . Pregnancy induced hypertension     unsure if PIH or CHTN - no meds  . Anxiety     no meds currently  . Obstructive sleep apnea     does not use CPAP  . Seasonal allergies   . Pyelonephritis     history - no current problem  . S/P cesarean section 10/23/2012   Past Surgical History  Procedure Laterality  Date  . Tonsillectomy    . Left breast abcess    . Cholecystectomy    . Adenoidectomy    . Cesarean section  11/20/2010    Procedure: CESAREAN SECTION;  Surgeon: Oliver PilaKathy W Richardson;  Location: [facility];  Service: Gynecology;  Laterality: N/A;  . Wisdom tooth extraction    . Cesarean section N/A 10/21/2012    Procedure: REPEAT CESAREAN SECTION;  Surgeon: Oliver PilaKathy W Richardson, MD;  Location: [facility];  Service: Obstetrics;  Laterality: N/A;   Family History  Problem Relation Age of Onset  . Adopted: Yes   History  Substance Use Topics  . Smoking status: Current Every Day Smoker -- 0.50 packs/day    Types: Cigarettes    Last Attempt to Quit: 03/01/2012  . Smokeless tobacco: Never Used  . Alcohol Use: No   OB History   Grav Para Term Preterm Abortions TAB SAB Ect Mult Living   2 2 2  0 0 0 0 0 0 2     Review of Systems  Constitutional: Positive for chills and diaphoresis. Negative for fever and appetite change.  HENT: Negative for congestion.   Respiratory: Positive for shortness of breath. Negative for cough, wheezing and stridor.   Cardiovascular: Positive for chest pain. Negative for palpitations and leg swelling.  Gastrointestinal: Negative for nausea, vomiting, abdominal pain and diarrhea.  Musculoskeletal:  Positive for myalgias.        Left arm  Neurological: Positive for numbness.  All other systems reviewed and are negative.      Allergies  Corticosteroids and Diphenhydramine hcl  Home Medications   Current Outpatient Rx  Name  Route  Sig  Dispense  Refill  . levothyroxine (SYNTHROID, LEVOTHROID) 112 MCG tablet   Oral   Take 112 mcg by mouth every morning.          . traMADol (ULTRAM) 50 MG tablet   Oral   Take 1 tablet (50 mg total) by mouth every 6 (six) hours as needed.   15 tablet   0    BP 136/94  Pulse 102  Temp(Src) 99.3 F (37.4 C) (Oral)  Resp 20  Ht 5\' 1"  (1.549 m)  Wt 270 lb (122.471 kg)  BMI 51.04 kg/m2  SpO2 100%  LMP 04/16/2013 Physical  Exam  Nursing note and vitals reviewed. Constitutional: She appears well-developed and well-nourished. No distress.  Pt lying comfortably in exam bed, NAD.   HENT:  Head: Normocephalic and atraumatic.  Eyes: Conjunctivae are normal. No scleral icterus.  Neck: Normal range of motion. Neck supple.  Cardiovascular: Normal rate, regular rhythm and normal heart sounds.   Pulmonary/Chest: Effort normal and breath sounds normal. No respiratory distress. She has no wheezes. She has no rales. She exhibits tenderness ( reproducible, left sided chest tenderness).  Abdominal: Soft. Bowel sounds are normal. She exhibits no distension and no mass. There is no tenderness. There is no rebound and no guarding.  Soft, non-distended, non-tender  Musculoskeletal: Normal range of motion.  Neurological: She is alert.  Skin: Skin is warm and dry. She is not diaphoretic.    ED Course  Procedures (including critical care time) Labs Review Labs Reviewed  CBC  BASIC METABOLIC PANEL  D-DIMER, QUANTITATIVE  I-STAT TROPOININ, ED  POC URINE PREG, ED   Imaging Review Dg Chest 2 View  04/18/2013   CLINICAL DATA:  Chest pain and shortness of Breath.  EXAM: CHEST  2 VIEW  COMPARISON:  07/24/2009.  FINDINGS: The heart size and mediastinal contours are within normal limits. Both lungs are clear. The visualized skeletal structures are unremarkable.  IMPRESSION: No active cardiopulmonary disease.   Electronically Signed   By: Loralie Champagne M.D.   On: 04/18/2013 20:35     EKG Interpretation None      MDM   Final diagnoses:  Chest pain    Pt c/o chest pain that is intermittent x2 days. Hx of same. Reports negative echocardiogram last year. Denies hx of CAD.  Cardiac workup started in triage: EKG, CBC, BMP, istat troponin and urine preg.  Labs: unremarkable. EKG: NSR CXR: unremarkable.  Denies hx of blood clots, pt is low risk for PE as she does take birth control and c/o SOB.  Will get D-dimer.       D-dimer: unremarkable.    Pt appears well.  Not concerned for emergent process taking place at this time.  Will discharge pt home with pain medication. Advised to f/u with PCP.  Return precautions provided. Pt verbalized understanding and agreement with tx plan.  Discussed pt with Dr. Fayrene Fearing who agrees with plan.   Junius Finner, PA-C 04/18/13 2240

## 2013-04-18 NOTE — ED Notes (Signed)
Pt reports centralized burning CP that began x2 days ago after eating, states she took antacids without relief, states yesterday she felt bloating and tightness so she took a medication for gas relief without relief. Pt states she had similar CP last year and had an echocardiogram, but the pain is worse now and is also ShOB, nauseated, lightheaded and weak. Pt states the pain will shoot down her L arm and will become numb. Pt a&o x4, ambulatory to triage.

## 2013-04-18 NOTE — ED Notes (Signed)
Initial Contact - pt to RM5 with c/o intermittent central CP, rad into LUE, x2 days.  Not reproducible.  NSR on monitor, no ectopy noted at this time.  Pt reports intermittent SOB and lightheadedness also, denies currently.  Pt denies hx of similar.  Skin PWD.  Speaking full/clear sentences, rr even/un-lab.  MAEI.  Pt placed to cardiac/02 monitor, NAD.  Awaiting EDP eval.

## 2013-04-18 NOTE — ED Notes (Signed)
Pt to radiology.

## 2013-04-26 NOTE — ED Provider Notes (Signed)
Medical screening examination/treatment/procedure(s) were performed by non-physician practitioner and as supervising physician I was immediately available for consultation/collaboration.   EKG Interpretation   Date/Time:  Tuesday April 18 2013 19:29:58 EST Ventricular Rate:  108 PR Interval:  117 QRS Duration: 65 QT Interval:  317 QTC Calculation: 425 R Axis:   39 Text Interpretation:  Age not entered, assumed to be  28 years old for  purpose of ECG interpretation Sinus tachycardia Borderline T  abnormalities, anterior leads ED PHYSICIAN INTERPRETATION AVAILABLE IN  [facility] Confirmed by TEST, Record ([id number]) on 04/20/2013 7:27:29 AM        Rolland PorterMark Jaydynn Wolford, MD 04/26/13 [phone]

## 2013-08-15 ENCOUNTER — Encounter (HOSPITAL_COMMUNITY): Payer: Self-pay | Admitting: Emergency Medicine

## 2013-08-15 ENCOUNTER — Emergency Department (HOSPITAL_COMMUNITY)
Admission: EM | Admit: 2013-08-15 | Discharge: 2013-08-15 | Disposition: A | Discharge status: Home / Self Care | Payer: Medicaid Other | Attending: Emergency Medicine | Admitting: Emergency Medicine

## 2013-08-15 DIAGNOSIS — Z87440 Personal history of urinary (tract) infections: Secondary | ICD-10-CM | POA: Insufficient documentation

## 2013-08-15 DIAGNOSIS — Z8619 Personal history of other infectious and parasitic diseases: Secondary | ICD-10-CM | POA: Insufficient documentation

## 2013-08-15 DIAGNOSIS — R519 Headache, unspecified: Secondary | ICD-10-CM

## 2013-08-15 DIAGNOSIS — R51 Headache: Secondary | ICD-10-CM

## 2013-08-15 DIAGNOSIS — I10 Essential (primary) hypertension: Secondary | ICD-10-CM | POA: Insufficient documentation

## 2013-08-15 DIAGNOSIS — Z87891 Personal history of nicotine dependence: Secondary | ICD-10-CM | POA: Insufficient documentation

## 2013-08-15 DIAGNOSIS — Z3202 Encounter for pregnancy test, result negative: Secondary | ICD-10-CM | POA: Insufficient documentation

## 2013-08-15 DIAGNOSIS — Z8669 Personal history of other diseases of the nervous system and sense organs: Secondary | ICD-10-CM | POA: Insufficient documentation

## 2013-08-15 DIAGNOSIS — Z8742 Personal history of other diseases of the female genital tract: Secondary | ICD-10-CM | POA: Insufficient documentation

## 2013-08-15 DIAGNOSIS — Z8659 Personal history of other mental and behavioral disorders: Secondary | ICD-10-CM | POA: Insufficient documentation

## 2013-08-15 DIAGNOSIS — H53149 Visual discomfort, unspecified: Secondary | ICD-10-CM | POA: Insufficient documentation

## 2013-08-15 DIAGNOSIS — Z8614 Personal history of Methicillin resistant Staphylococcus aureus infection: Secondary | ICD-10-CM | POA: Insufficient documentation

## 2013-08-15 DIAGNOSIS — Z8719 Personal history of other diseases of the digestive system: Secondary | ICD-10-CM | POA: Insufficient documentation

## 2013-08-15 DIAGNOSIS — Z79899 Other long term (current) drug therapy: Secondary | ICD-10-CM | POA: Insufficient documentation

## 2013-08-15 DIAGNOSIS — E039 Hypothyroidism, unspecified: Secondary | ICD-10-CM | POA: Insufficient documentation

## 2013-08-15 DIAGNOSIS — R6883 Chills (without fever): Secondary | ICD-10-CM | POA: Insufficient documentation

## 2013-08-15 DIAGNOSIS — Z87448 Personal history of other diseases of urinary system: Secondary | ICD-10-CM | POA: Insufficient documentation

## 2013-08-15 LAB — POC URINE PREG, ED: Preg Test, Ur: NEGATIVE

## 2013-08-15 LAB — URINALYSIS, ROUTINE W REFLEX MICROSCOPIC
BILIRUBIN URINE: NEGATIVE
GLUCOSE, UA: NEGATIVE mg/dL
Ketones, ur: NEGATIVE mg/dL
Nitrite: NEGATIVE
PROTEIN: NEGATIVE mg/dL
Specific Gravity, Urine: 1.016 (ref 1.005–1.030)
Urobilinogen, UA: 0.2 mg/dL (ref 0.0–1.0)
pH: 5.5 (ref 5.0–8.0)

## 2013-08-15 LAB — I-STAT CHEM 8, ED
BUN: 16 mg/dL (ref 6–23)
Calcium, Ion: 1.32 mmol/L — ABNORMAL HIGH (ref 1.12–1.23)
Chloride: 106 mEq/L (ref 96–112)
Creatinine, Ser: 0.8 mg/dL (ref 0.50–1.10)
Glucose, Bld: 96 mg/dL (ref 70–99)
HCT: 47 % — ABNORMAL HIGH (ref 36.0–46.0)
HEMOGLOBIN: 16 g/dL — AB (ref 12.0–15.0)
Potassium: 4 mEq/L (ref 3.7–5.3)
SODIUM: 141 meq/L (ref 137–147)
TCO2: 23 mmol/L (ref 0–100)

## 2013-08-15 LAB — URINE MICROSCOPIC-ADD ON

## 2013-08-15 LAB — CK TOTAL AND CKMB (NOT AT ARMC)
CK, MB: <1 ng/mL (ref 0.3–4.0)
Total CK: 61 U/L (ref 7–177)

## 2013-08-15 MED ORDER — KETOROLAC TROMETHAMINE 30 MG/ML IJ SOLN
30.0000 mg | Freq: Once | INTRAMUSCULAR | Status: AC
  Administered 2013-08-15: 30 mg via INTRAVENOUS
  Filled 2013-08-15: qty 1

## 2013-08-15 MED ORDER — METOCLOPRAMIDE HCL 5 MG/ML IJ SOLN
10.0000 mg | Freq: Once | INTRAMUSCULAR | Status: AC
  Administered 2013-08-15: 10 mg via INTRAVENOUS
  Filled 2013-08-15: qty 2

## 2013-08-15 MED ORDER — SODIUM CHLORIDE 0.9 % IV BOLUS (SEPSIS)
1000.0000 mL | Freq: Once | INTRAVENOUS | Status: AC
  Administered 2013-08-15: 1000 mL via INTRAVENOUS

## 2013-08-15 NOTE — ED Provider Notes (Signed)
Medical screening examination/treatment/procedure(s) were performed by non-physician practitioner and as supervising physician I was immediately available for consultation/collaboration.   EKG Interpretation None       Olga M Otter, MD 08/15/13 0635 

## 2013-08-15 NOTE — ED Notes (Signed)
Pt states she is having a really bad headache that started last night  Pt states she is currently being treated for a UTI  Pt states she went to her PCP yesterday and they changed her antibiotics  Pt states she is feeling worse today  Pt says she has body aches all over, periods of chills and sweating

## 2013-08-15 NOTE — ED Provider Notes (Signed)
CSN: [id number]     Arrival date & time 08/15/13  0415 History   First MD Initiated Contact with Patient 08/15/13 0422     Chief Complaint  Patient presents with  . Headache  . Generalized Body Aches   HPI  History provided by the patient. Patient is a 28 year old female with history of hypertension, obesity, hypothyroidism who presents with complaints of headache. Patient reports some intermittent headache that began yesterday evening. She was having slight off and on pains across her forehead and into her neck and shoulders. This became much worse and more persistent early this morning. She did try taking Tylenol around midnight without significant improvements. She feels a lot of tightness throughout her neck and shoulder muscles as well as sharp pains in her face and behind the eyes. Patient does report recently being treated for a UTI. States she was initially on a sulfa medication but she has adverse effects and was switched to amoxicillin and then subsequently switched again to Cipro which she has been taking for a few days. She denies having any past reactions to taking Cipro. Denies any past history of similar headaches. Headache is worse with bright lights, movement and noise. No other aggravating or alleviating factors. No associated symptoms. No fever, weakness, numbness, confusion or speech change.   Past Medical History  Diagnosis Date  . Hypothyroidism   . Obesity, morbid (more than 100 lbs over ideal weight or BMI > 40)   . Bipolar affective   . OCD (obsessive compulsive disorder)   . Oppositional defiant disorder   . Herpes     last outbreak 4 months ago  . Hyperprolactinemia   . GERD (gastroesophageal reflux disease)   . Left breast abscess     not MRSA  . UTI (lower urinary tract infection)   . Trichomonas   . BV (bacterial vaginosis)   . Hemorrhoids   . Hypertension   . Pregnancy induced hypertension     unsure if PIH or CHTN - no meds  . Anxiety     no meds  currently  . Obstructive sleep apnea     does not use CPAP  . Seasonal allergies   . Pyelonephritis     history - no current problem  . S/P cesarean section 10/23/2012   Past Surgical History  Procedure Laterality Date  . Tonsillectomy    . Left breast abcess    . Cholecystectomy    . Adenoidectomy    . Cesarean section  11/20/2010    Procedure: CESAREAN SECTION;  Surgeon: Oliver PilaKathy W Richardson;  Location: [facility];  Service: Gynecology;  Laterality: N/A;  . Wisdom tooth extraction    . Cesarean section N/A 10/21/2012    Procedure: REPEAT CESAREAN SECTION;  Surgeon: Oliver PilaKathy W Richardson, MD;  Location: [facility];  Service: Obstetrics;  Laterality: N/A;   Family History  Problem Relation Age of Onset  . Adopted: Yes   History  Substance Use Topics  . Smoking status: Former Smoker -- 0.50 packs/day    Types: Cigarettes    Quit date: 03/01/2012  . Smokeless tobacco: Never Used  . Alcohol Use: No   OB History   Grav Para Term Preterm Abortions TAB SAB Ect Mult Living   2 2 2  0 0 0 0 0 0 2     Review of Systems  Constitutional: Positive for chills. Negative for fever.  Eyes: Positive for photophobia. Negative for visual disturbance.  Respiratory: Negative for cough.   Gastrointestinal: Negative  for nausea and vomiting.  Neurological: Positive for headaches. Negative for weakness and numbness.  Psychiatric/Behavioral: Negative for confusion.  All other systems reviewed and are negative.     Allergies  Corticosteroids; Diphenhydramine hcl; and Sulfa antibiotics  Home Medications   Prior to Admission medications   Medication Sig Start Date End Date Taking? Authorizing Provider  levothyroxine (SYNTHROID, LEVOTHROID) 112 MCG tablet Take 112 mcg by mouth every morning.     Historical Provider, MD   BP 162/89  Pulse 83  Temp(Src) 98.6 F (37 C) (Oral)  Resp 18  SpO2 98% Physical Exam  Nursing note and vitals reviewed. Constitutional: She is oriented to person, place, and time.  She appears well-developed and well-nourished. No distress.  HENT:  Head: Normocephalic and atraumatic.  Eyes: Conjunctivae and EOM are normal. Pupils are equal, round, and reactive to light.  Neck: Normal range of motion. Neck supple.  No meningeal signs  Cardiovascular: Normal rate and regular rhythm.   No murmur heard. Pulmonary/Chest: Effort normal and breath sounds normal. No respiratory distress. She has no wheezes. She has no rales.  Abdominal: Soft. There is no tenderness. There is no rigidity, no rebound, no guarding, no CVA tenderness and no tenderness at McBurney's point.  Musculoskeletal: Normal range of motion.  Neurological: She is alert and oriented to person, place, and time. She has normal strength. No cranial nerve deficit or sensory deficit. Gait normal.  Skin: Skin is warm and dry. No rash noted.  Psychiatric: She has a normal mood and affect. Her behavior is normal.    ED Course  Procedures   COORDINATION OF CARE:  Nursing notes reviewed. Vital signs reviewed. Initial pt interview and examination performed.   Filed Vitals:   08/15/13 0424  BP: 162/89  Pulse: 83  Temp: 98.6 F (37 C)  TempSrc: Oral  Resp: 18  SpO2: 98%    4:42 AM-patient seen and evaluated. Patient appears uncomfortable no acute distress. Normal nonfocal neurologic exam. Past review of EMR shows that patient did have complaints of severe headache one year ago. She underwent MRI which did not show any signs for concerning cause.  6:00 AM patient feeling significantly better after medications for the headache. Electrolytes without any concerning findings. UA appears improved without significant signs of UTI.  [facility] ordered by mistake no concerns for any cardiac symptoms.  Pt may be d/c at this time.  She has been given neurology followup for further evaluation of her headache.   Treatment plan initiated: Medications  sodium chloride 0.9 % bolus 1,000 mL (not administered)  metoCLOPramide  (REGLAN) injection 10 mg (not administered)  ketorolac (TORADOL) 30 MG/ML injection 30 mg (not administered)    Results for orders placed during the hospital encounter of 08/15/13  URINALYSIS, ROUTINE W REFLEX MICROSCOPIC      Result Value Ref Range   Color, Urine YELLOW  YELLOW   APPearance CLOUDY (*) CLEAR   Specific Gravity, Urine 1.016  1.005 - 1.030   pH 5.5  5.0 - 8.0   Glucose, UA NEGATIVE  NEGATIVE mg/dL   Hgb urine dipstick LARGE (*) NEGATIVE   Bilirubin Urine NEGATIVE  NEGATIVE   Ketones, ur NEGATIVE  NEGATIVE mg/dL   Protein, ur NEGATIVE  NEGATIVE mg/dL   Urobilinogen, UA 0.2  0.0 - 1.0 mg/dL   Nitrite NEGATIVE  NEGATIVE   Leukocytes, UA TRACE (*) NEGATIVE  URINE MICROSCOPIC-ADD ON      Result Value Ref Range   Squamous Epithelial / LPF FEW (*)  RARE   WBC, UA 0-2  <3 WBC/hpf   RBC / HPF 3-6  <3 RBC/hpf   Bacteria, UA FEW (*) RARE  POC URINE PREG, ED      Result Value Ref Range   Preg Test, Ur NEGATIVE  NEGATIVE  I-STAT CHEM 8, ED      Result Value Ref Range   Sodium 141  137 - 147 mEq/L   Potassium 4.0  3.7 - 5.3 mEq/L   Chloride 106  96 - 112 mEq/L   BUN 16  6 - 23 mg/dL   Creatinine, Ser 1.610.80  0.50 - 1.10 mg/dL   Glucose, Bld 96  70 - 99 mg/dL   Calcium, Ion 0.961.32 (*) 1.12 - 1.23 mmol/L   TCO2 23  0 - 100 mmol/L   Hemoglobin 16.0 (*) 12.0 - 15.0 g/dL   HCT 04.547.0 (*) 40.936.0 - 81.146.0 %      MDM   Final diagnoses:  Intractable episodic headache, unspecified headache type        Angus Sellereter S Dammen, PA-C 08/15/13 [phone]

## 2013-08-15 NOTE — Discharge Instructions (Signed)
You were seen and treated for your headache. At this time your providers do not think your headache was caused by any emergent condition. Your urinary tract infection appears to be improving. Continue medications as prescribed and followup with your primary care provider or a neurology specialist for continued evaluation of her headaches.  General Headache Without Cause A headache is pain or discomfort felt around the head or neck area. The specific cause of a headache may not be found. There are many causes and types of headaches. A few common ones are:  Tension headaches.  Migraine headaches.  Cluster headaches.  Chronic daily headaches. HOME CARE INSTRUCTIONS   Keep all follow-up appointments with your caregiver or any specialist referral.  Only take over-the-counter or prescription medicines for pain or discomfort as directed by your caregiver.  Lie down in a dark, quiet room when you have a headache.  Keep a headache journal to find out what may trigger your migraine headaches. For example, write down:  What you eat and drink.  How much sleep you get.  Any change to your diet or medicines.  Try massage or other relaxation techniques.  Put ice packs or heat on the head and neck. Use these 3 to 4 times per day for 15 to 20 minutes each time, or as needed.  Limit stress.  Sit up straight, and do not tense your muscles.  Quit smoking if you smoke.  Limit alcohol use.  Decrease the amount of caffeine you drink, or stop drinking caffeine.  Eat and sleep on a regular schedule.  Get 7 to 9 hours of sleep, or as recommended by your caregiver.  Keep lights dim if bright lights bother you and make your headaches worse. SEEK MEDICAL CARE IF:   You have problems with the medicines you were prescribed.  Your medicines are not working.  You have a change from the usual headache.  You have nausea or vomiting. SEEK IMMEDIATE MEDICAL CARE IF:   Your headache becomes  severe.  You have a fever.  You have a stiff neck.  You have loss of vision.  You have muscular weakness or loss of muscle control.  You start losing your balance or have trouble walking.  You feel faint or pass out.  You have severe symptoms that are different from your first symptoms. MAKE SURE YOU:   Understand these instructions.  Will watch your condition.  Will get help right away if you are not doing well or get worse. Document Released: 02/02/2005 Document Revised: 04/27/2011 Document Reviewed: 02/18/2011 [facility] Patient Information 2015 [employer] [place]. This information is not intended to replace advice given to you by your health care provider. Make sure you discuss any questions you have with your health care provider.

## 2013-10-17 ENCOUNTER — Ambulatory Visit (INDEPENDENT_AMBULATORY_CARE_PROVIDER_SITE_OTHER): Payer: Medicaid Other | Admitting: Neurology

## 2013-10-17 ENCOUNTER — Encounter: Payer: Self-pay | Admitting: Neurology

## 2013-10-17 VITALS — BP 137/79 | HR 100 | Temp 97.8°F | Ht 61.0 in | Wt 281.0 lb

## 2013-10-17 DIAGNOSIS — H538 Other visual disturbances: Secondary | ICD-10-CM

## 2013-10-17 DIAGNOSIS — R51 Headache: Secondary | ICD-10-CM

## 2013-10-17 DIAGNOSIS — H4710 Unspecified papilledema: Secondary | ICD-10-CM

## 2013-10-17 DIAGNOSIS — G4733 Obstructive sleep apnea (adult) (pediatric): Secondary | ICD-10-CM

## 2013-10-17 NOTE — Progress Notes (Signed)
Subjective:    Simpson ID: Grace Simpson is a 28 y.o. female.  HPI    Interim history:   Grace Simpson is a 28 year old right-handed woman with an underlying complex medical history of obesity, hypothyroidism, OCD, GAD and panic disorder, reflux disease, prior UTI, hypertension, anxiety, history of pyelonephritis, history of BV and Trichomonas, OSA, not on CPAP, history of pyelonephritis, and seasonal allergies, who presents for followup consultation. She has recently been noted to have papilledema. She is unaccompanied today. I first met her on 12/20/2012 after a recent emergency room visit on 12/12/2012 with a complaint of new onset headache. She had recently had a C-section about 8 weeks prior. She presented with a intermittent left-sided headache of 7-10 days duration with some associated neurological issues including left tongue numbness and speech impairment as well as intermittent confusion. She has no history of blood clots and no prior history of headaches. She denied photophobia upon presentation and no prodromal illness. She had a CT angiogram head on 12/13/2012: No acute intracranial venous or arterial abnormality, chronic narrowing of Grace bilateral transverse sinuses, this finding could be clinically relevant if there is history of chronic headaches, vision changes or increased intracranial pressure. She was discharged from [facility] emergency room on 12/13/2012 after reporting that she felt better. She had no focal neurologic finding at Grace time. She had a brain MRI w/wo contrast in 6/10: Negative cranial MRI with special attention to Grace pituitary.  At Grace time of her visit with me I suggested a brain MRI without contrast which she had on 01/03/2013: This was reported as normal. In addition, I personally reviewed Grace images through Grace PACS system. I also suggested reevaluation of her obstructive sleep apnea with a sleep study. She no showed for her sleep study on 02/18/2013. She had no evidence of  papilledema at Grace time. She was seen by her optometrist, Dr. Len Blalock, on 10/12/2013, and was found to have bilateral papilledema.  Today, she reports having had a posterior HA, R more than L, constant, tightness, nausea, no vomiting, very rare photophobia for Grace past 2-3 months, ringing in her ears, fullness and occasional pulsatile rushing in her ears for Grace past 5-6 months. She snores mildly, had been diagnosed with OSA in Grace past. She is trying to lose weight. She has very little blurry vision and no obscurations, no metamorphopsia, and went for a routine eye appointment when she was found to have b/l papilledema. She has gained over 40 lb since her last pregnancy. Her baby will be turning 12 mo this week.   She reported a bilateral headache, which was described as constant aching in bifrontal area. No N/V/photo or sonophobia. There is no family history of migraine.  Treatments tried include OTC NSAIDs.  Grace Simpson denies prior TIA or stroke symptoms, such as sudden onset of one sided weakness, numbness, tingling, slurring of speech or droopy face, hearing loss, tinnitus, diplopia or visual field cut or monocular loss of vision. She does have occasional blurry vision and no visual obscuration with bending down or with bearing down, and pain is not positional.  She has had intermittent vertigo and currently has no HA, but mild vertigo, no nausea.   Her Past Medical History Is Significant For: Past Medical History  Diagnosis Date  . Hypothyroidism   . Obesity, morbid (more than 100 lbs over ideal weight or BMI > 40)   . Bipolar affective   . OCD (obsessive compulsive disorder)   . Oppositional defiant  disorder   . Herpes     last outbreak 4 months ago  . Hyperprolactinemia   . GERD (gastroesophageal reflux disease)   . Left breast abscess     not MRSA  . UTI (lower urinary tract infection)   . Trichomonas   . BV (bacterial vaginosis)   . Hemorrhoids   . Hypertension   .  Pregnancy induced hypertension     unsure if PIH or CHTN - no meds  . Anxiety     no meds currently  . Obstructive sleep apnea     does not use CPAP  . Seasonal allergies   . Pyelonephritis     history - no current problem  . S/P cesarean section 10/23/2012  . Headache(784.0)   . Migraines   . Anxiety     Her Past Surgical History Is Significant For: Past Surgical History  Procedure Laterality Date  . Tonsillectomy  1997  . Left breast abcess Left 2010  . Cholecystectomy  2009  . Adenoidectomy    . Cesarean section  11/20/2010    Procedure: CESAREAN SECTION;  Surgeon: Logan Bores;  Location: [facility];  Service: Gynecology;  Laterality: N/A;  . Wisdom tooth extraction    . Cesarean section N/A 10/21/2012    Procedure: REPEAT CESAREAN SECTION;  Surgeon: Logan Bores, MD;  Location: [facility];  Service: Obstetrics;  Laterality: N/A;    Her Family History Is Significant For: Family History  Problem Relation Age of Onset  . Adopted: Yes    Her Social History Is Significant For: History   Social History  . Marital Status: Married    Spouse Name: Lance Morin    Number of Children: 2  . Years of Education: 12   Occupational History  .      not employed   Social History Main Topics  . Smoking status: Former Smoker -- 0.50 packs/day    Types: Cigarettes    Quit date: 03/01/2012  . Smokeless tobacco: Never Used  . Alcohol Use: No  . Drug Use: No  . Sexual Activity: Not Currently    Birth Control/ Protection: None     Comment: pregnant   Other Topics Concern  . None   Social History Narrative   Simpson is right handed and resides with husband and children    Her Allergies Are:  Allergies  Allergen Reactions  . Corticosteroids Other (See Comments)    Adverse effect  . Diphenhydramine Hcl Other (See Comments)    Adverse effect  . Sulfa Antibiotics Hives  :   Her Current Medications Are:  Outpatient Encounter Prescriptions as of 10/17/2013  Medication Sig  .  fluticasone (FLONASE) 50 MCG/ACT nasal spray Place 2 sprays into both nostrils every morning.  Marland Kitchen levonorgestrel (MIRENA) 20 MCG/24HR IUD 1 each by Intrauterine route once.  Marland Kitchen levothyroxine (SYNTHROID, LEVOTHROID) 112 MCG tablet Take 112 mcg by mouth every morning.   . loratadine (CLARITIN) 10 MG tablet Take 10 mg by mouth daily as needed for allergies.  . [DISCONTINUED] acetaminophen (TYLENOL) 500 MG tablet Take 1,000 mg by mouth every 6 (six) hours as needed for mild pain.  :  Review of Systems:  Out of a complete 14 point review of systems, all are reviewed and negative with Grace exception of these symptoms as listed below:   Review of Systems  Constitutional: Positive for fever, chills and fatigue.  HENT:       Spinning sensation,ringing in ears  Gastrointestinal: Positive for constipation.  Endocrine: Positive for heat intolerance.  Musculoskeletal:       Joint pain,cramps,aching muscles  Allergic/Immunologic: Positive for environmental allergies and food allergies.       Skin sensitivity,food allergies include:cantaloupe,honeydew-itching throat, shortness of breath  Neurological: Positive for dizziness and headaches.       Insomnia, sleepiness  Psychiatric/Behavioral:       Anxiety, not enough sleep, decreased energy    Objective:  Neurologic Exam  Physical Exam Physical Examination:   Filed Vitals:   10/17/13 1332  BP: 137/79  Pulse: 100  Temp: 97.8 F (36.6 C)   General Examination: Grace Simpson is a very pleasant 28 y.o. female in no acute distress. She appears well-developed and well-nourished and adequately groomed. She is obese.  HEENT: Normocephalic, atraumatic, pupils are equal, round and reactive to light and accommodation. Funduscopic exam is pertinent for mild papilledema on Grace R and possible papilledema on Grace L. Extraocular tracking is good without limitation to gaze excursion or nystagmus noted. Visual fields are full by finger perimetry.  Normal smooth  pursuit is noted. Hearing is grossly intact. Tympanic membranes are clear bilaterally. Face is symmetric with normal facial animation and normal facial sensation. Speech is clear with no dysarthria noted. There is no hypophonia. There is no lip, neck/head, jaw or voice tremor. Neck is supple with full range of passive and active motion. There are no carotid bruits on auscultation. Oropharynx exam reveals: mild mouth dryness, adequate dental hygiene and moderate airway crowding, due to narrow airway entry and larger tongue. Mallampati is class II. Tongue protrudes centrally and palate elevates symmetrically. Tonsils are absent. Neck size is 16 inches.   Chest: Clear to auscultation without wheezing, rhonchi or crackles noted.  Heart: S1+S2+0, regular and normal without murmurs, rubs or gallops noted.   Abdomen: Soft, non-tender and non-distended with normal bowel sounds appreciated on auscultation.  Extremities: There is no pitting edema in Grace distal lower extremities bilaterally. Pedal pulses are intact.  Skin: Warm and dry without trophic changes noted. There are no varicose veins.  Musculoskeletal: exam reveals no obvious joint deformities, tenderness or joint swelling or erythema.   Neurologically:  Mental status: Grace Simpson is awake, alert and oriented in all 4 spheres. Her memory, attention, language and knowledge are appropriate. There is no aphasia, agnosia, apraxia or anomia. Speech is clear with normal prosody and enunciation. Thought process is linear. Mood is congruent and affect is normal.  Cranial nerves are as described above under HEENT exam. In addition, shoulder shrug is normal with equal shoulder height noted. Motor exam: Normal bulk, strength and tone is noted. There is no drift, tremor or rebound. Romberg is negative. Reflexes are 2+ throughout. Toes are downgoing bilaterally. Fine motor skills are intact with normal finger taps, normal hand movements, normal rapid alternating  patting, normal foot taps and normal foot agility.  Cerebellar testing shows no dysmetria or intention tremor on finger to nose testing. Heel to shin is unremarkable bilaterally. There is no truncal or gait ataxia.  Sensory exam is intact to light touch, pinprick, vibration, temperature sense and proprioception in Grace upper and lower extremities.  Gait, station and balance are unremarkable. No veering to one side is noted. No leaning to one side is noted. Posture is age-appropriate and stance is narrow based. No problems turning are noted. She turns en bloc. Tandem walk is unremarkable. Intact toe and heel stance is noted.               Assessment  and Plan:   In summary, Shahad Mazurek is a very pleasant 28 year old female with a history of recurrent headaches and mild visual impairment and recent routine eye exam revealing b/l Papilledema. Her physical exam is nonfocal with Grace exception of obesity and R>L papilledema. Given her obesity and her papilledema b/l, she certainly is at risk for BIH, or PTC (pseudotumor cerebri). I would like to proceed with MRV of brain and also repeat a sleep study because she has been diagnosed with sleep apnea in Grace past is not using CPAP. She is advised that her symptoms of occasional dizziness, occasional vertigo, occasional headaches and occasional tinnitus with pulsatile tinnitus reported may also be interconnected with possible intracranial hypertension. While I did not suggest any new medication at this time, would like to proceed with a spinal fluid test with opening pressure test as well as reduction of her pressure if she indeed has intracranial hypertension. We will also do routine spinal fluid testing on Grace fluid. She is advised that we may proceed with a medication to help keep her intracranial hypertension at day such as Diamox or Topamax if need be. I will see her back soon after her diagnostic tests are done. At this juncture we will proceed with a lumbar  puncture, MRV brain, sleep study evaluation. I did ask Grace Simpson to try strive for weight loss.  I answered all her questions today and Grace Simpson was in agreement with Grace above outlined plan. I would like to see Grace Simpson back soon, after Grace tests are completed. She is advised to call with any interim questions, concerns, problems or updates.

## 2013-10-17 NOTE — Patient Instructions (Addendum)
You may have a condition called pseudotumor cerebri, which means that there increased fluid pressure around your brain.  1. We will do a brain MRV 2. We continue with formal eye exams through Dr. Laural Benes 3. We will do a LP with pressure testing and routine fluid testing.  4. We may consider a medication (such as diamox) to help keep your spinal fluid pressure at bay.  5. We will reschedule you for a sleep study.  The most serious complication of having pseudotumor cerebri is loss of vision which can be permanent. It may help to lose weight.

## 2013-10-19 ENCOUNTER — Telehealth: Payer: Self-pay | Admitting: *Deleted

## 2013-10-19 NOTE — Telephone Encounter (Signed)
Calling since has not heard about referrals of MRI/ LP.  I called [facility] and the orders are there.  Pt given number [phone] to call in the am and see about scheduling appt. She verbalized understanding.

## 2013-10-25 ENCOUNTER — Ambulatory Visit
Admission: RE | Admit: 2013-10-25 | Discharge: 2013-10-25 | Disposition: A | Discharge status: Home / Self Care | Payer: Medicaid Other | Source: Ambulatory Visit | Attending: Neurology | Admitting: Neurology

## 2013-10-25 ENCOUNTER — Telehealth: Payer: Self-pay | Admitting: Neurology

## 2013-10-25 DIAGNOSIS — R51 Headache: Secondary | ICD-10-CM

## 2013-10-25 DIAGNOSIS — H4710 Unspecified papilledema: Secondary | ICD-10-CM

## 2013-10-25 DIAGNOSIS — F429 Obsessive-compulsive disorder, unspecified: Secondary | ICD-10-CM

## 2013-10-25 DIAGNOSIS — H538 Other visual disturbances: Secondary | ICD-10-CM

## 2013-10-25 DIAGNOSIS — G4733 Obstructive sleep apnea (adult) (pediatric): Secondary | ICD-10-CM

## 2013-10-25 DIAGNOSIS — E039 Hypothyroidism, unspecified: Secondary | ICD-10-CM

## 2013-10-25 DIAGNOSIS — G932 Benign intracranial hypertension: Secondary | ICD-10-CM

## 2013-10-25 LAB — CSF CELL COUNT WITH DIFFERENTIAL
RBC COUNT CSF: 0 uL
RBC Count, CSF: 0 cu mm
TUBE #: 1
Tube #: 4
WBC, CSF: 1 cu mm (ref 0–5)
WBC, CSF: 2 cu mm (ref 0–5)

## 2013-10-25 LAB — PROTEIN, CSF: Total Protein, CSF: 16 mg/dL (ref 15–45)

## 2013-10-25 LAB — GLUCOSE, CSF: Glucose, CSF: 59 mg/dL (ref 43–76)

## 2013-10-25 NOTE — Discharge Instructions (Signed)
Lumbar Puncture Discharge Instructions ° °1. Go home and rest quietly for the next 24 hours.  It is important to lie flat for the next 24 hours.  Get up only to go to the restroom.  You may lie in the bed or on a couch on your back, your stomach, your left side or your right side.  You may have one pillow under your head.  You may have pillows between your knees while you are on your side or under your knees while you are on your back. ° °2. DO NOT drive today.  Recline the seat as far back as it will go, while still wearing your seat belt, on the way home. ° °3. You may get up to go to the bathroom as needed.  You may sit up for 10 minutes to eat.  You may resume your normal diet and medications unless otherwise indicated.  Drink plenty of extra fluids today and tomorrow. ° °4. The incidence of a spinal headache with nausea and/or vomiting is about 5% (one in 20 patients).  If you develop a headache, lie flat and drink plenty of fluids until the headache goes away.  Caffeinated beverages may be helpful.  If you develop severe nausea and vomiting or a headache that does not go away with flat bed rest, please call the physician who referred you here. ° °5. You may resume normal activities after your 24 hours of bed rest is over; however, do not exert yourself strongly or do any heavy lifting tomorrow.  Please call us at [phone] with any questions. ° °6. Call your physician for a follow-up appointment.  °

## 2013-10-25 NOTE — Telephone Encounter (Signed)
This patient has benign intracranial hypertension. She is listed to have a sulfa antibiotic allergy. Can I start her on Diamox or is just absolutely contraindicated. Please see which can fine now. Can you also try to find out from the patient what type of reaction she really had with a sulfa antibiotic? Would topamax be an option?

## 2013-10-25 NOTE — Telephone Encounter (Signed)
I called the patient back to clarify allergy info.  Got no answer.  Left message.   Diamox is listed as "generally not recommended" with a Sulfa allergy.  Topamax does not list a contraindication with Sulfa allergy.

## 2013-10-27 MED ORDER — TOPIRAMATE 25 MG PO TABS: ORAL_TABLET | ORAL | Status: DC

## 2013-10-27 NOTE — Progress Notes (Signed)
Quick Note:  Ammie: Please call patient and advise her that her lumbar puncture did indeed confirm that her spinal fluid pressure was elevated as suspected. This is in keeping with the diagnosis of pseudotumor cerebri as discussed. All labs so far are normal on the spinal fluid, which is reassuring.  Please advise patient that I would like to get her started on the medication Diamox I mentioned to her. However I am not sure if she can take this with her history of sulfa allergy. I am trying to find out.  Jessica: can you comment on this? Can she take topamax?  sa ______

## 2013-10-27 NOTE — Telephone Encounter (Signed)
I called the pharmacy.  Spoke with [employer].  She said the Rx was received and processed without any issues or contraindications.  I called the patient back.  Got no answer.  Left message with directions, as well advised if she experienced any allergic/adverse reactions to stop taking medication and seek immediate medical attention.  Asked that she call us back with any questions.

## 2013-10-27 NOTE — Telephone Encounter (Signed)
Jessica: pls call pt to advise her that I have placed a prescription for Topamax. Continue go over the instructions with her? For some reason her allergies are still flagging the Topamax prescription. There should not be a cross reaction with sulfa allergy right?  Thanks for your help.

## 2013-10-27 NOTE — Telephone Encounter (Signed)
I called again.  Spoke with patient.  She said sulfa caused hives all over her body and itching.

## 2013-10-28 LAB — CSF CULTURE W GRAM STAIN
Gram Stain: NONE SEEN
Gram Stain: NONE SEEN

## 2013-10-28 LAB — CSF CULTURE: ORGANISM ID, BACTERIA: NO GROWTH

## 2013-10-29 ENCOUNTER — Ambulatory Visit
Admission: RE | Admit: 2013-10-29 | Discharge: 2013-10-29 | Disposition: A | Discharge status: Home / Self Care | Payer: Medicaid Other | Source: Ambulatory Visit | Attending: Neurology | Admitting: Neurology

## 2013-10-29 DIAGNOSIS — R51 Headache: Secondary | ICD-10-CM

## 2013-10-29 DIAGNOSIS — H538 Other visual disturbances: Secondary | ICD-10-CM

## 2013-10-29 DIAGNOSIS — G4733 Obstructive sleep apnea (adult) (pediatric): Secondary | ICD-10-CM

## 2013-10-29 DIAGNOSIS — H4710 Unspecified papilledema: Secondary | ICD-10-CM

## 2013-10-31 ENCOUNTER — Telehealth: Payer: Self-pay | Admitting: *Deleted

## 2013-10-31 NOTE — Progress Notes (Signed)
Quick Note:  Shared message with patient per Dr Frances Furbish, she verbalized understanding ______

## 2013-10-31 NOTE — Telephone Encounter (Signed)
Called and shared results with patient and shared message from [place] concerning prescription(Topamax),and she verbalized understanding.

## 2013-11-06 ENCOUNTER — Telehealth: Payer: Self-pay | Admitting: Neurology

## 2013-11-06 LAB — VIRAL CULTURE VIRC: Organism ID, Bacteria: NEGATIVE

## 2013-11-06 NOTE — Telephone Encounter (Signed)
Patient calling to discuss one of her medications, and also to check if her MRI results are ready. Please return call and advise.

## 2013-11-07 NOTE — Telephone Encounter (Signed)
I spoke to pt regarding medication prescibed, topaxmax and her feeling of not being comfortable with the drug, another option?   She took one dose of this , then read up on drug and also spoke to person taking medication and there SE.  I told her that everyone is different and responds differently to the medication.  She still did not want to take.    MRI results also.  Pts cell # [phone] cell, may LM.

## 2013-11-07 NOTE — Telephone Encounter (Signed)
Unfortunately the only other option for her medication that we typically utilize for pseudotumor cerebri is Diamox but because she is allergic to sulfa Diamox is not really safe to take. Topamax is the other option we utilize and usually get very good results. If she has not had a reaction to it I would recommend that she start Topamax and see how it goes. It takes the body up to 14 days to get used to a new medication and most typically this medication is well-tolerated. However, every medication can cause side effects and she is encouraged to watch for side effects. Unfortunately for medical management of pseudotumor cerebri there is not a  whole lot of options. Her MRV which is the brain blood vessel test that shows the veins, showed no significant abnormality except for developmental findings. Again, I would recommend that she give the Topamax a chance.

## 2013-11-07 NOTE — Telephone Encounter (Signed)
LMVM for pt of below.  She is to call back if questions.  I encouraged her to call back.

## 2013-11-08 NOTE — Telephone Encounter (Signed)
I called pt checking to make sure she got my message.  She did not have any questions at this time.

## 2013-11-20 LAB — FUNGUS CULTURE W SMEAR: Smear Result: NONE SEEN

## 2013-11-30 ENCOUNTER — Other Ambulatory Visit: Payer: Self-pay | Admitting: Family Medicine

## 2013-11-30 DIAGNOSIS — N6320 Unspecified lump in the left breast, unspecified quadrant: Secondary | ICD-10-CM

## 2013-12-07 ENCOUNTER — Ambulatory Visit
Admission: RE | Admit: 2013-12-07 | Discharge: 2013-12-07 | Disposition: A | Discharge status: Home / Self Care | Payer: Medicaid Other | Source: Ambulatory Visit | Attending: Family Medicine | Admitting: Family Medicine

## 2013-12-07 DIAGNOSIS — N6320 Unspecified lump in the left breast, unspecified quadrant: Secondary | ICD-10-CM

## 2013-12-11 ENCOUNTER — Ambulatory Visit (INDEPENDENT_AMBULATORY_CARE_PROVIDER_SITE_OTHER): Payer: Medicaid Other

## 2013-12-11 ENCOUNTER — Ambulatory Visit (INDEPENDENT_AMBULATORY_CARE_PROVIDER_SITE_OTHER): Payer: Medicaid Other | Admitting: Podiatry

## 2013-12-11 ENCOUNTER — Encounter: Payer: Self-pay | Admitting: Podiatry

## 2013-12-11 VITALS — BP 142/88 | HR 96 | Resp 16

## 2013-12-11 DIAGNOSIS — M722 Plantar fascial fibromatosis: Secondary | ICD-10-CM

## 2013-12-11 DIAGNOSIS — M79671 Pain in right foot: Secondary | ICD-10-CM

## 2013-12-11 DIAGNOSIS — M779 Enthesopathy, unspecified: Secondary | ICD-10-CM

## 2013-12-11 DIAGNOSIS — M7750 Other enthesopathy of unspecified foot: Secondary | ICD-10-CM

## 2013-12-11 MED ORDER — TRIAMCINOLONE ACETONIDE 10 MG/ML IJ SUSP: 10.0000 mg | Freq: Once | INTRAMUSCULAR | Status: AC

## 2013-12-11 NOTE — Progress Notes (Signed)
   Subjective:    Patient ID: Grace Simpson, female    DOB: 06/19/1985, 28 y.o.   MRN: [id number]  HPI Comments: "I have pain in the bottom"  Patient c/o throbbing plantar forefoot right for 1.5 week. She thought she may have stepped on something. Initially, was swollen, but not now. Walking uncomfortable. She has tried soaking and a "drawing salve" - no help.     Review of Systems  Musculoskeletal: Positive for gait problem.  Allergic/Immunologic: Positive for environmental allergies and food allergies.  All other systems reviewed and are negative.      Objective:   Physical Exam        Assessment & Plan:

## 2013-12-11 NOTE — Progress Notes (Signed)
Subjective:     Patient ID: Grace Simpson, female   DOB: 08/05/1985, 28 y.o.   MRN: [id number]  Foot Pain   patient presents stating I have pain underneath my right foot and I do not remember any specific injury to it it just has been hurting me for the last approximately 2-3 weeks   Review of Systems  All other systems reviewed and are negative.      Objective:   Physical Exam  Nursing note and vitals reviewed. Constitutional: She is oriented to person, place, and time.  Cardiovascular: Intact distal pulses.   Musculoskeletal: Normal range of motion.  Neurological: She is oriented to person, place, and time.  Skin: Skin is warm and dry.   neurovascular status found to be intact with muscle strength adequate and range of motion within normal limits. Patient is noted to have mild plantar edema around the first metatarsal that's painful when pressed not exquisite and not located to one specific spot underneath the metatarsal head. 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 Today I injected the plantar capsule 3 mg Kenalog 5 mg Xylocaine advised her reduced activity and to reappoint if symptoms were to persist area reappoint as needed

## 2013-12-18 ENCOUNTER — Encounter: Payer: Self-pay | Admitting: Podiatry

## 2013-12-22 ENCOUNTER — Ambulatory Visit: Payer: Medicaid Other | Admitting: Podiatrist

## 2014-01-05 ENCOUNTER — Ambulatory Visit
Admission: RE | Admit: 2014-01-05 | Discharge: 2014-01-05 | Disposition: A | Discharge status: Home / Self Care | Payer: Medicaid Other | Source: Ambulatory Visit | Attending: Family Medicine | Admitting: Family Medicine

## 2014-01-05 ENCOUNTER — Other Ambulatory Visit: Payer: Self-pay | Admitting: Family Medicine

## 2014-01-05 DIAGNOSIS — R5383 Other fatigue: Secondary | ICD-10-CM

## 2014-01-05 DIAGNOSIS — N6019 Diffuse cystic mastopathy of unspecified breast: Secondary | ICD-10-CM

## 2014-01-31 ENCOUNTER — Emergency Department (HOSPITAL_COMMUNITY)
Admission: EM | Admit: 2014-01-31 | Discharge: 2014-01-31 | Disposition: A | Discharge status: Home / Self Care | Payer: Medicaid Other | Attending: Emergency Medicine | Admitting: Emergency Medicine

## 2014-01-31 ENCOUNTER — Emergency Department (HOSPITAL_COMMUNITY): Payer: Medicaid Other

## 2014-01-31 ENCOUNTER — Encounter (HOSPITAL_COMMUNITY): Payer: Self-pay | Admitting: *Deleted

## 2014-01-31 DIAGNOSIS — Z87440 Personal history of urinary (tract) infections: Secondary | ICD-10-CM | POA: Diagnosis not present

## 2014-01-31 DIAGNOSIS — Z8619 Personal history of other infectious and parasitic diseases: Secondary | ICD-10-CM | POA: Insufficient documentation

## 2014-01-31 DIAGNOSIS — R1013 Epigastric pain: Secondary | ICD-10-CM | POA: Insufficient documentation

## 2014-01-31 DIAGNOSIS — Z87891 Personal history of nicotine dependence: Secondary | ICD-10-CM | POA: Diagnosis not present

## 2014-01-31 DIAGNOSIS — I10 Essential (primary) hypertension: Secondary | ICD-10-CM | POA: Diagnosis not present

## 2014-01-31 DIAGNOSIS — Z8719 Personal history of other diseases of the digestive system: Secondary | ICD-10-CM | POA: Diagnosis not present

## 2014-01-31 DIAGNOSIS — Z8669 Personal history of other diseases of the nervous system and sense organs: Secondary | ICD-10-CM | POA: Diagnosis not present

## 2014-01-31 DIAGNOSIS — Z8742 Personal history of other diseases of the female genital tract: Secondary | ICD-10-CM | POA: Diagnosis not present

## 2014-01-31 DIAGNOSIS — Z9889 Other specified postprocedural states: Secondary | ICD-10-CM | POA: Insufficient documentation

## 2014-01-31 DIAGNOSIS — R0602 Shortness of breath: Secondary | ICD-10-CM | POA: Diagnosis not present

## 2014-01-31 DIAGNOSIS — E039 Hypothyroidism, unspecified: Secondary | ICD-10-CM | POA: Insufficient documentation

## 2014-01-31 DIAGNOSIS — R079 Chest pain, unspecified: Secondary | ICD-10-CM | POA: Diagnosis present

## 2014-01-31 DIAGNOSIS — Z8659 Personal history of other mental and behavioral disorders: Secondary | ICD-10-CM | POA: Diagnosis not present

## 2014-01-31 LAB — I-STAT TROPONIN, ED: Troponin i, poc: 0 ng/mL (ref 0.00–0.08)

## 2014-01-31 LAB — PRO B NATRIURETIC PEPTIDE: PRO B NATRI PEPTIDE: 56.3 pg/mL (ref 0–125)

## 2014-01-31 LAB — CBC
HCT: 39.4 % (ref 36.0–46.0)
Hemoglobin: 13.3 g/dL (ref 12.0–15.0)
MCH: 30.4 pg (ref 26.0–34.0)
MCHC: 33.8 g/dL (ref 30.0–36.0)
MCV: 90 fL (ref 78.0–100.0)
PLATELETS: 287 10*3/uL (ref 150–400)
RBC: 4.38 MIL/uL (ref 3.87–5.11)
RDW: 14.4 % (ref 11.5–15.5)
WBC: 9.3 10*3/uL (ref 4.0–10.5)

## 2014-01-31 LAB — COMPREHENSIVE METABOLIC PANEL
ALBUMIN: 3.6 g/dL (ref 3.5–5.2)
ALT: 16 U/L (ref 0–35)
AST: 11 U/L (ref 0–37)
Alkaline Phosphatase: 73 U/L (ref 39–117)
Anion gap: 15 (ref 5–15)
BUN: 12 mg/dL (ref 6–23)
CALCIUM: 9.8 mg/dL (ref 8.4–10.5)
CO2: 23 mEq/L (ref 19–32)
Chloride: 106 mEq/L (ref 96–112)
Creatinine, Ser: 0.8 mg/dL (ref 0.50–1.10)
GFR calc Af Amer: >90 mL/min (ref >90)
GFR calc non Af Amer: >90 mL/min (ref >90)
Glucose, Bld: 119 mg/dL — ABNORMAL HIGH (ref 70–99)
Potassium: 3.9 mEq/L (ref 3.7–5.3)
SODIUM: 144 meq/L (ref 137–147)
Total Bilirubin: <0.2 mg/dL — ABNORMAL LOW (ref 0.3–1.2)
Total Protein: 8 g/dL (ref 6.0–8.3)

## 2014-01-31 LAB — PROTIME-INR
INR: 0.93 (ref 0.00–1.49)
Prothrombin Time: 12.5 seconds (ref 11.6–15.2)

## 2014-01-31 LAB — LIPASE, BLOOD: LIPASE: 27 U/L (ref 11–59)

## 2014-01-31 LAB — D-DIMER, QUANTITATIVE: D-Dimer, Quant: 0.42 ug/mL-FEU (ref 0.00–0.48)

## 2014-01-31 MED ORDER — SODIUM CHLORIDE 0.9 % IV SOLN
1000.0000 mL | INTRAVENOUS | Status: DC
  Administered 2014-01-31: 1000 mL via INTRAVENOUS

## 2014-01-31 MED ORDER — NAPROXEN 500 MG PO TABS: 500.0000 mg | ORAL_TABLET | Freq: Two times a day (BID) | ORAL | Status: DC

## 2014-01-31 NOTE — ED Notes (Signed)
PT states that she has had chest pain x 1 week; pt states that she has had shortness of breath, left arm pain radiating from neck to left hand; and left hand numbness; pt states that it has been getting progressively worse over the last week; pt denies any cardiac hx

## 2014-01-31 NOTE — Discharge Instructions (Signed)

## 2014-01-31 NOTE — ED Provider Notes (Signed)
CSN: [id number]     Arrival date & time 01/31/14  0122 History   First MD Initiated Contact with Patient 01/31/14 0140     Chief Complaint  Patient presents with  . Chest Pain    Patient is a 28 y.o. female presenting with chest pain. The history is provided by the patient.  Chest Pain Pain location:  L chest and R chest Pain quality: pressure   Pain radiates to:  L shoulder (neck and arm) Pain severity:  Moderate Onset quality:  Gradual Duration:  1 week Timing:  Constant Worsened by:  Movement Associated symptoms: shortness of breath   Associated symptoms: no abdominal pain, no cough and no fever   Risk factors: birth control   Risk factors: no prior DVT/PE   No history of heart disease. Adopted.  Family history unkown. She has noticed when she eats the foods seems to be sitting there but it does not cause pain. Past Medical History  Diagnosis Date  . Hypothyroidism   . Obesity, morbid (more than 100 lbs over ideal weight or BMI > 40)   . Bipolar affective   . OCD (obsessive compulsive disorder)   . Oppositional defiant disorder   . Herpes     last outbreak 4 months ago  . Hyperprolactinemia   . GERD (gastroesophageal reflux disease)   . Left breast abscess     not MRSA  . UTI (lower urinary tract infection)   . Trichomonas   . BV (bacterial vaginosis)   . Hemorrhoids   . Hypertension   . Pregnancy induced hypertension     unsure if PIH or CHTN - no meds  . Anxiety     no meds currently  . Obstructive sleep apnea     does not use CPAP  . Seasonal allergies   . Pyelonephritis     history - no current problem  . S/P cesarean section 10/23/2012  . Headache(784.0)   . Migraines   . Anxiety    Past Surgical History  Procedure Laterality Date  . Tonsillectomy  1997  . Left breast abcess Left 2010  . Cholecystectomy  2009  . Adenoidectomy    . Cesarean section  11/20/2010    Procedure: CESAREAN SECTION;  Surgeon: Oliver PilaKathy W Richardson;  Location: [facility];  Service:  Gynecology;  Laterality: N/A;  . Wisdom tooth extraction    . Cesarean section N/A 10/21/2012    Procedure: REPEAT CESAREAN SECTION;  Surgeon: Oliver PilaKathy W Richardson, MD;  Location: [facility];  Service: Obstetrics;  Laterality: N/A;   Family History  Problem Relation Age of Onset  . Adopted: Yes   History  Substance Use Topics  . Smoking status: Former Smoker -- 0.50 packs/day    Types: Cigarettes    Quit date: 03/01/2012  . Smokeless tobacco: Never Used  . Alcohol Use: No   OB History    Gravida Para Term Preterm AB TAB SAB Ectopic Multiple Living   2 2 2  0 0 0 0 0 0 2     Review of Systems  Constitutional: Negative for fever.  Respiratory: Positive for shortness of breath. Negative for cough.   Cardiovascular: Positive for chest pain.  Gastrointestinal: Negative for abdominal pain.  All other systems reviewed and are negative.     Allergies  Corticosteroids; Food; Diphenhydramine hcl; and Sulfa antibiotics  Home Medications   Prior to Admission medications   Medication Sig Start Date End Date Taking? Authorizing Provider  doxycycline (VIBRAMYCIN) 100 MG capsule  01/05/14  Yes Historical Provider, MD  fluticasone (FLONASE) 50 MCG/ACT nasal spray Place 2 sprays into both nostrils every morning.   Yes Historical Provider, MD  levothyroxine (SYNTHROID, LEVOTHROID) 112 MCG tablet Take 112 mcg by mouth every morning.    Yes Historical Provider, MD  loratadine (CLARITIN) 10 MG tablet Take 10 mg by mouth daily as needed for allergies.   Yes Historical Provider, MD  MICROGESTIN FE 1/20 1-20 MG-MCG tablet  01/04/14  Yes Historical Provider, MD  naproxen (NAPROSYN) 500 MG tablet Take 1 tablet (500 mg total) by mouth 2 (two) times daily with a meal. As needed for pain 01/31/14   Linwood Dibbles, MD   BP 144/84 mmHg  Pulse 94  Temp(Src) 98.5 F (36.9 C) (Oral)  Resp 18  SpO2 98%  LMP 01/10/2014 Physical Exam  Constitutional: She appears well-developed and well-nourished. No distress.   Obese   HENT:  Head: Normocephalic and atraumatic.  Right Ear: External ear normal.  Left Ear: External ear normal.  Eyes: Conjunctivae are normal. Right eye exhibits no discharge. Left eye exhibits no discharge. No scleral icterus.  Neck: Neck supple. No tracheal deviation present.  Cardiovascular: Normal rate, regular rhythm and intact distal pulses.   Pulmonary/Chest: Effort normal and breath sounds normal. No stridor. No respiratory distress. She has no wheezes. She has no rales. She exhibits tenderness.  Abdominal: Soft. Bowel sounds are normal. She exhibits no distension. There is tenderness (mild) in the epigastric area. There is no rebound and no guarding. No hernia.  Musculoskeletal: She exhibits no edema or tenderness.  Neurological: She is alert. She has normal strength. No cranial nerve deficit (no facial droop, extraocular movements intact, no slurred speech) or sensory deficit. She exhibits normal muscle tone. She displays no seizure activity. Coordination normal.  Skin: Skin is warm and dry. No rash noted.  Psychiatric: She has a normal mood and affect.  Nursing note and vitals reviewed.   ED Course  Procedures (including critical care time) Labs Review Labs Reviewed  COMPREHENSIVE METABOLIC PANEL - Abnormal; Notable for the following:    Glucose, Bld 119 (*)    Total Bilirubin <0.2 (*)    All other components within normal limits  CBC  PRO B NATRIURETIC PEPTIDE  PROTIME-INR  LIPASE, BLOOD  D-DIMER, QUANTITATIVE  I-STAT TROPOININ, ED    Imaging Review Dg Chest 2 View  01/31/2014   CLINICAL DATA:  Initial evaluation for acute chest pain.  EXAM: CHEST  2 VIEW  COMPARISON:  Prior study from 01/05/2014  FINDINGS: The cardiac and mediastinal silhouettes are stable in size and contour, and remain within normal limits.  The lungs are normally inflated. No airspace consolidation, pleural effusion, or pulmonary edema is identified. There is no pneumothorax.  No acute  osseous abnormality identified.  IMPRESSION: No active cardiopulmonary disease.   Electronically Signed   By: Rise Mu M.D.   On: 01/31/2014 03:49     EKG Interpretation   Date/Time:  Wednesday January 31 2014 01:37:57 EST Ventricular Rate:  111 PR Interval:  112 QRS Duration: 74 QT Interval:  317 QTC Calculation: 431 R Axis:   18 Text Interpretation:  Sinus tachycardia No significant change since last  tracing Confirmed by Merriel Zinger  MD-J, Alexx Giambra ([id number]) on 01/31/2014 1:47:31 AM      MDM   Final diagnoses:  Chest pain, unspecified chest pain type    Doubt ACS, PE.  ?pleurisy, ?viral  At this time there does not appear to be any evidence  of an acute emergency medical condition and the patient appears stable for discharge with appropriate outpatient follow up.     Linwood DibblesJon Stacyann Mcconaughy, MD 01/31/14 [phone]

## 2014-02-01 ENCOUNTER — Other Ambulatory Visit (INDEPENDENT_AMBULATORY_CARE_PROVIDER_SITE_OTHER): Payer: Self-pay

## 2014-02-01 DIAGNOSIS — N611 Abscess of the breast and nipple: Secondary | ICD-10-CM

## 2014-03-05 ENCOUNTER — Encounter: Payer: Medicaid Other | Attending: Obstetrics and Gynecology | Admitting: Skilled Nursing Facility1

## 2014-03-05 ENCOUNTER — Encounter: Payer: Self-pay | Admitting: Skilled Nursing Facility1

## 2014-03-05 DIAGNOSIS — Z713 Dietary counseling and surveillance: Secondary | ICD-10-CM | POA: Diagnosis not present

## 2014-03-05 DIAGNOSIS — Z6841 Body Mass Index (BMI) 40.0 and over, adult: Secondary | ICD-10-CM | POA: Diagnosis not present

## 2014-03-05 NOTE — Patient Instructions (Addendum)
-  Try ear plugs to sleep throughout the night -Talk to your doctor about physical activity -Try to eat breakfast every morning; start out with a fruit smoothie with frozen fruit, peanut butter, and a dash of milk -Do not limit the foods in your diet or the meals throughout the day

## 2014-03-05 NOTE — Progress Notes (Signed)
Medical Nutrition Therapy:  Appt start time: 0915 end time:  1015.   Assessment:  Primary concerns today: Referred for obesity. Patient states she has been overweight her entire life and was diagnosed with hypothyroidism 10 years ago. 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 She states she has Lost 70 pounds prior to her second pregnancy but has since gained some of that weight back 2 years ago. Prior to her first pregnancy she was 310 pounds.She lost that weight by walking and eating proper portions. She started losing weight because she had a condition (not stated) during her first pregnancy that would benefit from weight loss and continued on to the second pregnancy when she got off track. Her Childrens ages are 703 and 16 months. Patient states she was always in the range of 310 pounds before her pregnancies, she started gaining the weight at 29 years old. 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 In her first pregnancy she states she had a reaction to caffeine which made her anxious so she stopped consuming sodas and juices and just drinks water and caffeine free pepsi. Patient states weight loss seems daunting especially with hypothyroidism and she finds meal time stressful. Patient states she has to force herself to eat because she does not want to eat due to her weight and refuses to eat breakfast because of her fear of gaining more weight. She states she sleeps 8-9 hours but it is not restful she wakes up several times throughout the night. Patient states her thyroid medicine was just changed due to increased tiredness throughout the day. She states she is on a fixed income which makes it hard to buy fresh foods and receives food stamps. She states she feels tired throughout the day due to her kids. She states her husband is helpful with kids; her husband nor her  have a job but her husband has school at night. She states she does not do any physical activity because she gets dizzy due to her tumor. Patient states her doctors are pushing for her to get bariatric surgery but she is not sure if she should. This dietitian recommended she have that conversation with her doctor and her husband. This dietitian went through general information on the subject and suggested she adopt healthy eating habits regardless of her decision to do the surgery or not.  The patient seemed anxious and impatient with losing weight over time rather than in the next couple of weeks.    Preferred Learning Style:   No preference indicated   Learning Readiness:  Not ready   MEDICATIONS: See List   DIETARY INTAKE:  Usual eating pattern includes 2 meals and 1 snacks per day.  Everyday foods include none stated.  Avoided foods include none stated.    24-hr recall:  B ( AM): none Snk ( AM): none  L ( PM): 2 sandwhiches: [place]urkey and cheese Snk ( PM): popcorn----granola bar----chips D ( PM): fried chicken----baked fish----pasta----usually have meat, starch, vegetable Snk ( PM): none Beverages: water  Usual physical activity: ADL's  Estimated energy needs: 2000 calories 225 g carbohydrates 150 g protein 56 g fat  Progress Towards Goal(s):  In progress.   Nutritional Diagnosis:  NB-1.1 Food and nutrition-related knowledge deficit As related to no formal nutrition education.  As evidenced by meal skipping and no  physical activity.    Intervention:  Nutrition counseling for weight loss. Dietitian educated patient on proper portion sizes, the importance of eating enough food throughout the day, and the importance of physical activity (movement in general).   Goals:  -Try ear plugs to sleep throughout the night -Talk to your doctor about physical activity -Try to eat breakfast every morning; start out with a fruit smoothie with frozen fruit, peanut butter, and a dash  of milk -Do not limit the foods in your diet or the meals throughout the day  Teaching Method Utilized:  Visual Auditory  Barriers to learning/adherence to lifestyle change: Anxiety about food.  Demonstrated degree of understanding via:  Teach Back   Monitoring/Evaluation:  Dietary intake and body weight prn.

## 2014-04-11 ENCOUNTER — Other Ambulatory Visit (INDEPENDENT_AMBULATORY_CARE_PROVIDER_SITE_OTHER): Payer: Self-pay | Admitting: *Deleted

## 2014-04-11 DIAGNOSIS — N611 Abscess of the breast and nipple: Secondary | ICD-10-CM

## 2014-04-12 NOTE — Addendum Note (Signed)
Addended by: Javona Bergevin M on: 04/12/2014 06:35 AM   Modules accepted: Orders  

## 2014-04-12 NOTE — Addendum Note (Signed)
Addended by: Ernestene MentionINGRAM, Meighan Treto M on: 04/12/2014 06:35 AM   Modules accepted: Orders

## 2014-05-18 DIAGNOSIS — Z86711 Personal history of pulmonary embolism: Secondary | ICD-10-CM

## 2014-05-18 HISTORY — DX: Personal history of pulmonary embolism: Z86.711

## 2014-05-28 ENCOUNTER — Ambulatory Visit (INDEPENDENT_AMBULATORY_CARE_PROVIDER_SITE_OTHER): Payer: Medicaid Other

## 2014-05-28 ENCOUNTER — Encounter: Payer: Self-pay | Admitting: Podiatry

## 2014-05-28 ENCOUNTER — Ambulatory Visit (INDEPENDENT_AMBULATORY_CARE_PROVIDER_SITE_OTHER): Payer: Medicaid Other | Admitting: Podiatry

## 2014-05-28 VITALS — BP 143/83 | HR 104 | Resp 13 | Ht 61.0 in | Wt 280.0 lb

## 2014-05-28 DIAGNOSIS — M25571 Pain in right ankle and joints of right foot: Secondary | ICD-10-CM

## 2014-05-28 DIAGNOSIS — M79671 Pain in right foot: Secondary | ICD-10-CM

## 2014-05-28 DIAGNOSIS — M7750 Other enthesopathy of unspecified foot: Secondary | ICD-10-CM

## 2014-05-28 DIAGNOSIS — M779 Enthesopathy, unspecified: Secondary | ICD-10-CM

## 2014-05-28 MED ORDER — TRIAMCINOLONE ACETONIDE 10 MG/ML IJ SUSP
10.0000 mg | Freq: Once | INTRAMUSCULAR | Status: AC
  Administered 2014-05-28: 10 mg

## 2014-05-29 NOTE — Progress Notes (Signed)
Subjective:     Patient ID: Grace Simpson, female   DOB: 08/03/1985, 29 y.o.   MRN: [id number]  HPI patient presents with pain underneath the right first metatarsal head that she's had previous and had gotten better but now has recurred and pain in the right ankle lateral side that bothers her. She is markedly obese which is a complicating factor   Review of Systems     Objective:   Physical Exam Vascular status unchanged with chronic pain around the tibial sesamoid complex right with discomfort in the lateral ankle which may be due to sprain or walking differently because of the medial pain    Assessment:     Probable tibial sesamoid pain right with possibility of inflammatory chronic condition and peroneal tendinitis right    Plan:     Reviewed x-rays and today I did inject the lateral complex 3 mg Kenalog 5 g Xylocaine and the sub-sesamoid complex 3 mg Kenalog 5 mg Xylocaine. Placed an air fracture walker to immobilize and if not better we will need to consider possible tibial excision or possible MRI

## 2014-06-08 ENCOUNTER — Other Ambulatory Visit: Payer: Self-pay | Admitting: *Deleted

## 2014-06-08 ENCOUNTER — Other Ambulatory Visit (INDEPENDENT_AMBULATORY_CARE_PROVIDER_SITE_OTHER): Payer: Self-pay | Admitting: General Surgery

## 2014-06-08 DIAGNOSIS — N611 Abscess of the breast and nipple: Secondary | ICD-10-CM

## 2014-06-08 NOTE — Addendum Note (Signed)
Addended by: Ernestene MentionINGRAM, Abir Craine M on: 06/08/2014 05:26 PM   Modules accepted: Orders

## 2014-06-11 ENCOUNTER — Other Ambulatory Visit: Payer: Self-pay | Admitting: *Deleted

## 2014-06-11 DIAGNOSIS — N611 Abscess of the breast and nipple: Secondary | ICD-10-CM

## 2014-06-13 ENCOUNTER — Emergency Department (HOSPITAL_COMMUNITY): Payer: Medicaid Other

## 2014-06-13 ENCOUNTER — Observation Stay (HOSPITAL_COMMUNITY)
Admission: EM | Admit: 2014-06-13 | Discharge: 2014-06-14 | DRG: 176 | Disposition: A | Discharge status: Home / Self Care | Payer: Medicaid Other | Attending: Internal Medicine | Admitting: Internal Medicine

## 2014-06-13 ENCOUNTER — Encounter (HOSPITAL_COMMUNITY): Payer: Self-pay | Admitting: Emergency Medicine

## 2014-06-13 DIAGNOSIS — Z6841 Body Mass Index (BMI) 40.0 and over, adult: Secondary | ICD-10-CM | POA: Insufficient documentation

## 2014-06-13 DIAGNOSIS — Z79899 Other long term (current) drug therapy: Secondary | ICD-10-CM | POA: Insufficient documentation

## 2014-06-13 DIAGNOSIS — G4733 Obstructive sleep apnea (adult) (pediatric): Secondary | ICD-10-CM | POA: Diagnosis not present

## 2014-06-13 DIAGNOSIS — E039 Hypothyroidism, unspecified: Secondary | ICD-10-CM | POA: Diagnosis not present

## 2014-06-13 DIAGNOSIS — K219 Gastro-esophageal reflux disease without esophagitis: Secondary | ICD-10-CM | POA: Insufficient documentation

## 2014-06-13 DIAGNOSIS — Z792 Long term (current) use of antibiotics: Secondary | ICD-10-CM | POA: Insufficient documentation

## 2014-06-13 DIAGNOSIS — Z87891 Personal history of nicotine dependence: Secondary | ICD-10-CM | POA: Diagnosis not present

## 2014-06-13 DIAGNOSIS — Z7951 Long term (current) use of inhaled steroids: Secondary | ICD-10-CM | POA: Diagnosis not present

## 2014-06-13 DIAGNOSIS — Z882 Allergy status to sulfonamides status: Secondary | ICD-10-CM | POA: Insufficient documentation

## 2014-06-13 DIAGNOSIS — Z791 Long term (current) use of non-steroidal anti-inflammatories (NSAID): Secondary | ICD-10-CM | POA: Insufficient documentation

## 2014-06-13 DIAGNOSIS — I10 Essential (primary) hypertension: Secondary | ICD-10-CM | POA: Insufficient documentation

## 2014-06-13 DIAGNOSIS — I2699 Other pulmonary embolism without acute cor pulmonale: Secondary | ICD-10-CM | POA: Diagnosis not present

## 2014-06-13 DIAGNOSIS — R079 Chest pain, unspecified: Secondary | ICD-10-CM | POA: Diagnosis present

## 2014-06-13 LAB — BASIC METABOLIC PANEL
ANION GAP: 10 (ref 5–15)
BUN: 7 mg/dL (ref 6–23)
CHLORIDE: 107 mmol/L (ref 96–112)
CO2: 21 mmol/L (ref 19–32)
Calcium: 9.2 mg/dL (ref 8.4–10.5)
Creatinine, Ser: 0.9 mg/dL (ref 0.50–1.10)
GFR calc Af Amer: >90 mL/min (ref >90)
GFR calc non Af Amer: 87 mL/min — ABNORMAL LOW (ref >90)
Glucose, Bld: 89 mg/dL (ref 70–99)
POTASSIUM: 3.5 mmol/L (ref 3.5–5.1)
SODIUM: 138 mmol/L (ref 135–145)

## 2014-06-13 LAB — I-STAT TROPONIN, ED: TROPONIN I, POC: 0 ng/mL (ref 0.00–0.08)

## 2014-06-13 LAB — I-STAT BETA HCG BLOOD, ED (MC, WL, AP ONLY): I-stat hCG, quantitative: <5 m[IU]/mL (ref <5)

## 2014-06-13 LAB — CBC
HCT: 41.1 % (ref 36.0–46.0)
HEMOGLOBIN: 13.8 g/dL (ref 12.0–15.0)
MCH: 30.7 pg (ref 26.0–34.0)
MCHC: 33.6 g/dL (ref 30.0–36.0)
MCV: 91.3 fL (ref 78.0–100.0)
Platelets: 357 10*3/uL (ref 150–400)
RBC: 4.5 MIL/uL (ref 3.87–5.11)
RDW: 13.9 % (ref 11.5–15.5)
WBC: 7.9 10*3/uL (ref 4.0–10.5)

## 2014-06-13 LAB — BRAIN NATRIURETIC PEPTIDE: B NATRIURETIC PEPTIDE 5: 11 pg/mL (ref 0.0–100.0)

## 2014-06-13 MED ORDER — SODIUM CHLORIDE 0.9 % IV BOLUS (SEPSIS)
1000.0000 mL | Freq: Once | INTRAVENOUS | Status: AC
  Administered 2014-06-13: 1000 mL via INTRAVENOUS

## 2014-06-13 NOTE — ED Provider Notes (Signed)
CSN: [id number]     Arrival date & time 06/13/14  1922 History   First MD Initiated Contact with Patient 06/13/14 2304     This chart was scribed for Tomasita Crumble, MD by Arlan Organ, ED Scribe. This patient was seen in room A10C/A10C and the patient's care was started 11:09 PM.   Chief Complaint  Patient presents with  . Chest Pain   The history is provided by the patient. No language interpreter was used.    HPI Comments: Grace Simpson is a 29 y.o. female with a PMHx of hypothyroidism, OCD, GERD, and HTN who presents to the Emergency Department complaining of intermittent, ongoing, gradually worsened central chest pain onset earlier this morning while ambulating. Pain is described as dull/achy. Discomfort is exacerbated with movement and deep breathing. Pt also reports mild SOB, recent fever that has now resolved, nausea, and moderate back pain. No OTC medications or home remedies attempted prior to arrival. No recent chills, diaphoresis, hematuria, dysuria, or cough. Pt states she first went to her PCP earlier this evening but was sent over to ED for further evaluation before she could be seen by her doctor. However, EKG performed prior to leaving office. No recent long distance travel. She denies a history of blood clots. Pt currently takes oral contraceptives. Pt with known allergies to sulfa antibiotics, diphenhydramine HCL, and corticosteroids.  Past Medical History  Diagnosis Date  . Hypothyroidism   . Obesity, morbid (more than 100 lbs over ideal weight or BMI > 40)   . Bipolar affective   . OCD (obsessive compulsive disorder)   . Oppositional defiant disorder   . Herpes     last outbreak 4 months ago  . Hyperprolactinemia   . GERD (gastroesophageal reflux disease)   . Left breast abscess     not MRSA  . UTI (lower urinary tract infection)   . Trichomonas   . BV (bacterial vaginosis)   . Hemorrhoids   . Hypertension   . Pregnancy induced hypertension     unsure if PIH or CHTN  - no meds  . Anxiety     no meds currently  . Obstructive sleep apnea     does not use CPAP  . Seasonal allergies   . Pyelonephritis     history - no current problem  . S/P cesarean section 10/23/2012  . Headache(784.0)   . Migraines   . Anxiety    Past Surgical History  Procedure Laterality Date  . Tonsillectomy  1997  . Left breast abcess Left 2010  . Cholecystectomy  2009  . Adenoidectomy    . Cesarean section  11/20/2010    Procedure: CESAREAN SECTION;  Surgeon: Oliver Pila;  Location: [facility];  Service: Gynecology;  Laterality: N/A;  . Wisdom tooth extraction    . Cesarean section N/A 10/21/2012    Procedure: REPEAT CESAREAN SECTION;  Surgeon: Oliver Pila, MD;  Location: [facility];  Service: Obstetrics;  Laterality: N/A;   Family History  Problem Relation Age of Onset  . Adopted: Yes   History  Substance Use Topics  . Smoking status: Former Smoker -- 0.50 packs/day    Types: Cigarettes    Quit date: 03/01/2012  . Smokeless tobacco: Never Used  . Alcohol Use: No   OB History    Gravida Para Term Preterm AB TAB SAB Ectopic Multiple Living   0 0 0 0 0 0 2     Review of Systems  Constitutional: Positive  for fever. Negative for chills and diaphoresis.  HENT: Negative for congestion and sore throat.   Respiratory: Positive for shortness of breath. Negative for cough.   Cardiovascular: Positive for chest pain.  Gastrointestinal: Positive for nausea and abdominal pain. Negative for vomiting.  Genitourinary: Negative for dysuria and hematuria.  Musculoskeletal: Positive for back pain.  Skin: Negative for rash.  Psychiatric/Behavioral: Negative for confusion.      Allergies  Corticosteroids; Food; Diphenhydramine hcl; and Sulfa antibiotics  Home Medications   Prior to Admission medications   Medication Sig Start Date End Date Taking? Authorizing Provider  albuterol (PROVENTIL, VENTOLIN) (5 MG/ML) 0.5% NEBU Take 8.5 mg by nebulization continuous.     Historical Provider, MD  doxycycline (VIBRAMYCIN) 100 MG capsule  01/05/14   Historical Provider, MD  fluticasone (FLONASE) 50 MCG/ACT nasal spray Place 2 sprays into both nostrils every morning.    Historical Provider, MD  levothyroxine (SYNTHROID, LEVOTHROID) 112 MCG tablet Take 112 mcg by mouth every morning.     Historical Provider, MD  loratadine (CLARITIN) 10 MG tablet Take 10 mg by mouth daily as needed for allergies.    Historical Provider, MD  metroNIDAZOLE (FLAGYL) 500 MG tablet Take 500 mg by mouth 3 (three) times daily.    Historical Provider, MD  MICROGESTIN FE 1/20 1-20 MG-MCG tablet  01/04/14   Historical Provider, MD  naproxen (NAPROSYN) 500 MG tablet Take 1 tablet (500 mg total) by mouth 2 (two) times daily with a meal. As needed for pain Patient not taking: Reported on 05/28/2014 01/31/14   Linwood DibblesJon Knapp, MD   Triage Vitals: BP 159/94 mmHg  Pulse 105  Temp(Src) 99.1 F (37.3 C)  Resp 16  Ht 5\' 1"  (1.549 m)  Wt 280 lb (127.007 kg)  BMI 52.93 kg/m2  SpO2 98%  LMP 06/01/2014   Physical Exam  Constitutional: She is oriented to person, place, and time. She appears well-developed and well-nourished. No distress.  HENT:  Head: Normocephalic and atraumatic.  Nose: Nose normal.  Mouth/Throat: Oropharynx is clear and moist. No oropharyngeal exudate.  Eyes: Conjunctivae and EOM are normal. Pupils are equal, round, and reactive to light. No scleral icterus.  Neck: Normal range of motion. Neck supple. No JVD present. No tracheal deviation present. No thyromegaly present.  Cardiovascular: Normal rate, regular rhythm and normal heart sounds.  Exam reveals no gallop and no friction rub.   No murmur heard. Pulmonary/Chest: Effort normal and breath sounds normal. No respiratory distress. She has no wheezes. She exhibits no tenderness.  Abdominal: Soft. Bowel sounds are normal. She exhibits no distension and no mass. There is no tenderness. There is no rebound and no guarding.   Musculoskeletal: Normal range of motion. She exhibits no edema or tenderness.  Lymphadenopathy:    She has no cervical adenopathy.  Neurological: She is alert and oriented to person, place, and time. No cranial nerve deficit. She exhibits normal muscle tone.  Skin: Skin is warm and dry. No rash noted. No erythema. No pallor.  Psychiatric: She has a normal mood and affect. Judgment normal.  Nursing note and vitals reviewed.   ED Course  Procedures (including critical care time)  DIAGNOSTIC STUDIES: Oxygen Saturation is 98% on RA, Normal by my interpretation.    COORDINATION OF CARE: 11:05 PM- Will order EKG, BMP, CBC, I-stat troponin, hepatic function panel, lipase blood, CXR, and CT angio chest PE w/CM and or without CM. Will give fluids, Discussed treatment plan with pt at bedside and pt agreed to  plan.     Labs Review Labs Reviewed  BASIC METABOLIC PANEL - Abnormal; Notable for the following:    GFR calc non Af Amer 87 (*)    All other components within normal limits  CBC  BRAIN NATRIURETIC PEPTIDE  HEPATIC FUNCTION PANEL  LIPASE, BLOOD  I-STAT TROPOININ, ED  I-STAT BETA HCG BLOOD, ED ([facility], [facility], [facility] ONLY)    Imaging Review Dg Chest 2 View  06/13/2014   CLINICAL DATA:  29 year old female with a history of pain  EXAM: CHEST - 2 VIEW  COMPARISON:  None.  FINDINGS: Cardiomediastinal silhouette projects within normal limits in size and contour. No confluent airspace disease, pneumothorax, or pleural effusion.  No displaced fracture.  Unremarkable appearance of the upper abdomen.  IMPRESSION: No radiographic evidence of acute cardiopulmonary disease.  Signed,  Yvone Neu. Loreta Ave, DO  [facility]  [facility]   Electronically Signed   By: Gilmer Mor D.O.   On: 06/13/2014 20:22   Ct Angio Chest Pe W/cm &/or Wo Cm  06/14/2014   CLINICAL DATA:  Acute onset of mid chest pain and mild shortness of breath. Chest pain worsens with breathing.  Initial encounter.  EXAM: CT ANGIOGRAPHY CHEST WITH CONTRAST  TECHNIQUE: Multidetector CT imaging of the chest was performed using the standard protocol during bolus administration of intravenous contrast. Multiplanar CT image reconstructions and MIPs were obtained to evaluate the vascular anatomy.  CONTRAST:  80mL OMNIPAQUE IOHEXOL 350 MG/ML SOLN  COMPARISON:  Chest radiograph performed 06/13/2014  FINDINGS: A small pulmonary embolus is noted within a pulmonary artery branch to the right lower lobe.  Minimal bilateral atelectasis is noted. The lungs are otherwise clear. There is no evidence of significant focal consolidation, pleural effusion or pneumothorax. No masses are identified; no abnormal focal contrast enhancement is seen.  No mediastinal lymphadenopathy is seen. There is no evidence of pericardial effusion. Residual thymic tissue is within normal limits. The great vessels are unremarkable in appearance. No axillary lymphadenopathy is seen. The visualized portions of the thyroid gland are unremarkable in appearance.  The visualized portions of the liver and spleen are unremarkable.  No acute osseous abnormalities are seen.  Review of the MIP images confirms the above findings.  IMPRESSION: 1. Small pulmonary embolus noted within a pulmonary artery branch to the right lower lung lobe. 2. Minimal bilateral atelectasis noted; lungs otherwise clear.  Critical Value/emergent results were called by telephone at the time of interpretation on 06/14/2014 at 12:47 am to Dr. Tomasita Crumble, who verbally acknowledged these results.   Electronically Signed   By: Roanna Raider M.D.   On: 06/14/2014 00:48     EKG Interpretation   Date/Time:  Wednesday June 13 2014 19:25:10 EDT Ventricular Rate:  97 PR Interval:  112 QRS Duration: 62 QT Interval:  324 QTC Calculation: 411 R Axis:   32 Text Interpretation:  Normal sinus rhythm with sinus arrhythmia  Nonspecific ST and T wave abnormality Abnormal ECG No  significant change  since last tracing Confirmed by Erroll Luna [phone]) on 06/13/2014  11:03:10 PM      MDM   Final diagnoses:  None    Patient since emergency department for sudden onset chest and back pain while walking. She saw her primary care physician who sent her to the emergency department for CT scan of the chest. Office, patient had a fever to 101 and was in excruciating pain. She denies any blood work or testing done in the office.  Laboratory studies are unremarkable, chest x-ray does not show any evidence of pneumonia. EKG shows no change since last tracing and no ischemia. Patient was sent here by primary care physician for CT chest so that will be performed. Patient was also given 1 L of fluids bolus.  CT angiography reveals pulmonary embolism in the right lower lung lobe. This explains the patient's initial tachycardia upon presentation to the emergency department. She was informed of diagnosis, Lovenox will be given to her. Patient be admitted to try hospitalist for continued management. Patient does take Microgestin which has estrogen in it, this is her only risk factor.  I personally performed the services described in this documentation, which was scribed in my presence. The recorded information has been reviewed and is accurate.   Tomasita Crumble, MD 06/14/14 [phone]

## 2014-06-13 NOTE — ED Notes (Signed)
Pt. reports central chest pain with mild SOB and nausea onset this afternoon . Pain non radiating / denies diaphoresis .

## 2014-06-14 ENCOUNTER — Emergency Department: Payer: Medicaid Other

## 2014-06-14 ENCOUNTER — Emergency Department (HOSPITAL_COMMUNITY): Payer: Medicaid Other

## 2014-06-14 ENCOUNTER — Encounter (HOSPITAL_COMMUNITY): Payer: Self-pay

## 2014-06-14 DIAGNOSIS — I2699 Other pulmonary embolism without acute cor pulmonale: Principal | ICD-10-CM

## 2014-06-14 DIAGNOSIS — E039 Hypothyroidism, unspecified: Secondary | ICD-10-CM

## 2014-06-14 LAB — HEPATIC FUNCTION PANEL
ALT: 12 U/L (ref 0–35)
AST: 16 U/L (ref 0–37)
Albumin: 3.5 g/dL (ref 3.5–5.2)
Alkaline Phosphatase: 50 U/L (ref 39–117)
BILIRUBIN DIRECT: 0.2 mg/dL (ref 0.0–0.5)
BILIRUBIN INDIRECT: 0.5 mg/dL (ref 0.3–0.9)
TOTAL PROTEIN: 6.9 g/dL (ref 6.0–8.3)
Total Bilirubin: 0.7 mg/dL (ref 0.3–1.2)

## 2014-06-14 LAB — COMPREHENSIVE METABOLIC PANEL
ALT: 12 U/L (ref 0–35)
AST: 12 U/L (ref 0–37)
Albumin: 3.3 g/dL — ABNORMAL LOW (ref 3.5–5.2)
Alkaline Phosphatase: 50 U/L (ref 39–117)
Anion gap: 8 (ref 5–15)
BILIRUBIN TOTAL: 0.6 mg/dL (ref 0.3–1.2)
BUN: 7 mg/dL (ref 6–23)
CALCIUM: 8.8 mg/dL (ref 8.4–10.5)
CHLORIDE: 108 mmol/L (ref 96–112)
CO2: 22 mmol/L (ref 19–32)
Creatinine, Ser: 0.83 mg/dL (ref 0.50–1.10)
GFR calc Af Amer: >90 mL/min (ref >90)
GFR calc non Af Amer: >90 mL/min (ref >90)
Glucose, Bld: 86 mg/dL (ref 70–99)
Potassium: 3.2 mmol/L — ABNORMAL LOW (ref 3.5–5.1)
SODIUM: 138 mmol/L (ref 135–145)
Total Protein: 6.5 g/dL (ref 6.0–8.3)

## 2014-06-14 LAB — LIPASE, BLOOD: Lipase: 19 U/L (ref 11–59)

## 2014-06-14 LAB — CBC
HEMATOCRIT: 39.9 % (ref 36.0–46.0)
Hemoglobin: 13.5 g/dL (ref 12.0–15.0)
MCH: 30.9 pg (ref 26.0–34.0)
MCHC: 33.8 g/dL (ref 30.0–36.0)
MCV: 91.3 fL (ref 78.0–100.0)
PLATELETS: 339 10*3/uL (ref 150–400)
RBC: 4.37 MIL/uL (ref 3.87–5.11)
RDW: 13.7 % (ref 11.5–15.5)
WBC: 6.9 10*3/uL (ref 4.0–10.5)

## 2014-06-14 LAB — PROTIME-INR
INR: 1.03 (ref 0.00–1.49)
PROTHROMBIN TIME: 13.6 s (ref 11.6–15.2)

## 2014-06-14 MED ORDER — RIVAROXABAN (XARELTO) EDUCATION KIT FOR DVT/PE PATIENTS: PACK | Status: DC

## 2014-06-14 MED ORDER — RIVAROXABAN 15 MG PO TABS
15.0000 mg | ORAL_TABLET | Freq: Two times a day (BID) | ORAL | Status: DC
  Administered 2014-06-14: 15 mg via ORAL
  Filled 2014-06-14 (×4): qty 1

## 2014-06-14 MED ORDER — ACETAMINOPHEN 650 MG RE SUPP: 650.0000 mg | Freq: Four times a day (QID) | RECTAL | Status: DC | PRN

## 2014-06-14 MED ORDER — RIVAROXABAN 20 MG PO TABS: 20.0000 mg | ORAL_TABLET | Freq: Every day | ORAL | Status: DC

## 2014-06-14 MED ORDER — LEVOTHYROXINE SODIUM 137 MCG PO TABS
137.0000 ug | ORAL_TABLET | Freq: Every day | ORAL | Status: DC
  Administered 2014-06-14: 137 ug via ORAL
  Filled 2014-06-14 (×2): qty 1

## 2014-06-14 MED ORDER — IOHEXOL 350 MG/ML SOLN
100.0000 mL | Freq: Once | INTRAVENOUS | Status: AC | PRN
  Administered 2014-06-14: 80 mL via INTRAVENOUS

## 2014-06-14 MED ORDER — ONDANSETRON HCL 4 MG PO TABS: 4.0000 mg | ORAL_TABLET | Freq: Four times a day (QID) | ORAL | Status: DC | PRN

## 2014-06-14 MED ORDER — ACETAMINOPHEN 325 MG PO TABS: 650.0000 mg | ORAL_TABLET | Freq: Four times a day (QID) | ORAL | Status: DC | PRN

## 2014-06-14 MED ORDER — RIVAROXABAN 15 MG PO TABS: 15.0000 mg | ORAL_TABLET | Freq: Two times a day (BID) | ORAL | Status: DC

## 2014-06-14 MED ORDER — POTASSIUM CHLORIDE CRYS ER 20 MEQ PO TBCR
40.0000 meq | EXTENDED_RELEASE_TABLET | Freq: Once | ORAL | Status: AC
  Administered 2014-06-14: 40 meq via ORAL
  Filled 2014-06-14: qty 2

## 2014-06-14 MED ORDER — ONDANSETRON HCL 4 MG/2ML IJ SOLN: 4.0000 mg | Freq: Four times a day (QID) | INTRAMUSCULAR | Status: DC | PRN

## 2014-06-14 MED ORDER — ENOXAPARIN SODIUM 150 MG/ML ~~LOC~~ SOLN
1.0000 mg/kg | Freq: Once | SUBCUTANEOUS | Status: DC
  Filled 2014-06-14: qty 1

## 2014-06-14 MED ORDER — RIVAROXABAN (XARELTO) EDUCATION KIT FOR DVT/PE PATIENTS
PACK | Freq: Once | Status: AC
  Administered 2014-06-14: 09:00:00
  Filled 2014-06-14: qty 1

## 2014-06-14 MED ORDER — FLUTICASONE PROPIONATE 50 MCG/ACT NA SUSP
2.0000 | Freq: Every day | NASAL | Status: DC
  Administered 2014-06-14: 2 via NASAL
  Filled 2014-06-14: qty 16

## 2014-06-14 MED ORDER — SODIUM CHLORIDE 0.9 % IJ SOLN: 3.0000 mL | Freq: Two times a day (BID) | INTRAMUSCULAR | Status: DC

## 2014-06-14 NOTE — Progress Notes (Signed)
Pt discharged home.  Alert and oriented x4.  No c/o pain.  Education given on diet, activity, medication, and follow-up care and instructions.  Information given to pt on xarelto, and pt was given xarelto kit.  Pt verbalized understanding in regards to all education.  IV D/Cd.  Tele D/Cd.  Pt to be taken home by husband.

## 2014-06-14 NOTE — Progress Notes (Signed)
Utilization review completed.  

## 2014-06-14 NOTE — ED Notes (Signed)
MD at bedside. 

## 2014-06-14 NOTE — H&P (Signed)
[facility] History and Physical  Patient: Grace Simpson  MRN: [id number]  DOB: Mar 28, 1985  DOS: the patient was seen and examined on 06/14/2014 PCP: Leanor Rubenstein, MD  Referring physician: Dr. Mora Bellman Chief Complaint: Chest pain and back pain  HPI: Grace Simpson is a 29 y.o. female with Past medical history of hypothyroidism, obesity. The patient is presenting with complaints of 2 day history of back pain as well as chest pain. The pain is located on the middle of the back and it feels like a sharp stabbing pain. The pain gets worse with deep breath. At its worse it is 10 out of 10. At the time of my evaluation the patient pain was 0. She also had this pain radiating to her left chest and then coming into her center of the chest and radiating to her right side. She has some shortness of breath as well as dizziness during this morning. She denies any nausea or vomiting. Denies any diarrhea denies any weight loss denies any blood in the stool denies any night sweats denies any cough fever chills. She is taking oral contraceptive pills with a combination of progestin and estrogen. She mentions she is adopted and therefore she does not know her family history.  The patient is coming from home. And at her baseline independent for most of her ADL.  Review of Systems: as mentioned in the history of present illness.  A comprehensive review of the other systems is negative.  Past Medical History  Diagnosis Date  . Hypothyroidism   . Obesity, morbid (more than 100 lbs over ideal weight or BMI > 40)   . Bipolar affective   . OCD (obsessive compulsive disorder)   . Oppositional defiant disorder   . Herpes     last outbreak 4 months ago  . Hyperprolactinemia   . GERD (gastroesophageal reflux disease)   . Left breast abscess     not MRSA  . UTI (lower urinary tract infection)   . Trichomonas   . BV (bacterial vaginosis)   . Hemorrhoids   . Hypertension   . Pregnancy induced  hypertension     unsure if PIH or CHTN - no meds  . Anxiety     no meds currently  . Obstructive sleep apnea     does not use CPAP  . Seasonal allergies   . Pyelonephritis     history - no current problem  . S/P cesarean section 10/23/2012  . Headache(784.0)   . Migraines   . Anxiety    Past Surgical History  Procedure Laterality Date  . Tonsillectomy  1997  . Left breast abcess Left 2010  . Cholecystectomy  2009  . Adenoidectomy    . Cesarean section  11/20/2010    Procedure: CESAREAN SECTION;  Surgeon: Oliver Pila;  Location: [facility];  Service: Gynecology;  Laterality: N/A;  . Wisdom tooth extraction    . Cesarean section N/A 10/21/2012    Procedure: REPEAT CESAREAN SECTION;  Surgeon: Oliver Pila, MD;  Location: [facility];  Service: Obstetrics;  Laterality: N/A;   Social History:  reports that she quit smoking about 2 years ago. Her smoking use included Cigarettes. She smoked 0.50 packs per day. She has never used smokeless tobacco. She reports that she does not drink alcohol or use illicit drugs.  Allergies  Allergen Reactions  . Corticosteroids Other (See Comments)    Adverse effect, jitttery  . Food     Melon-swelling (lips/tongue) itching  . Diphenhydramine  Hcl Other (See Comments)    Adverse effect. jittery  . Sulfa Antibiotics Hives    Family History  Problem Relation Age of Onset  . Adopted: Yes  . Family history unknown: Yes    Prior to Admission medications   Medication Sig Start Date End Date Taking? Authorizing Provider  fluticasone (FLONASE) 50 MCG/ACT nasal spray Place 2 sprays into both nostrils every morning.   Yes Historical Provider, MD  levothyroxine (SYNTHROID, LEVOTHROID) 137 MCG tablet Take 137 mcg by mouth daily before breakfast.   Yes Historical Provider, MD  loratadine (CLARITIN) 10 MG tablet Take 10 mg by mouth daily as needed for allergies.   Yes Historical Provider, MD  MICROGESTIN FE 1/20 1-20 MG-MCG tablet Take 1 tablet by mouth  daily.  01/04/14  Yes Historical Provider, MD  naproxen (NAPROSYN) 500 MG tablet Take 1 tablet (500 mg total) by mouth 2 (two) times daily with a meal. As needed for pain Patient not taking: Reported on 05/28/2014 01/31/14   Linwood Dibbles, MD    Physical Exam: Filed Vitals:   06/14/14 0120 06/14/14 0122 06/14/14 0130 06/14/14 0246  BP: 144/99 144/99 150/93 145/97  Pulse: 80 64 75 80  Temp:    99.8 F (37.7 C)  Resp: Height:     (1.549 m)  Weight:    127.597 kg (281 lb 4.8 oz)  SpO2: 97% 98% 97% 98%    General: Alert, Awake and Oriented to Time, Place and Person. Appear in mild distress Eyes: PERRL ENT: Oral Mucosa clear moist. Neck: no JVD Cardiovascular: S1 and S2 Present, no Murmur, Peripheral Pulses Present Respiratory: Bilateral Air entry equal and Decreased,  Clear to Auscultation, no Crackles, no wheezes Abdomen: Bowel Sound present, Soft and non tender Skin: no Rash Extremities: Trace Pedal edema, no calf tenderness Neurologic: Grossly no focal neuro deficit.  Labs on Admission:  CBC:  Recent Labs Lab 06/13/14 1936  WBC 7.9  HGB 13.8  HCT 41.1  MCV 91.3  PLT 357    CMP     Component Value Date/Time   NA 138 06/13/2014 1936   K 3.5 06/13/2014 1936   CL 107 06/13/2014 1936   CO2 21 06/13/2014 1936   GLUCOSE 89 06/13/2014 1936   BUN 7 06/13/2014 1936   CREATININE 0.90 06/13/2014 1936   CALCIUM 9.2 06/13/2014 1936   PROT 6.9 06/13/2014 2315   ALBUMIN 3.5 06/13/2014 2315   AST 16 06/13/2014 2315   ALT 12 06/13/2014 2315   ALKPHOS 50 06/13/2014 2315   BILITOT 0.7 06/13/2014 2315   GFRNONAA 87* 06/13/2014 1936   GFRAA >90 06/13/2014 1936     Recent Labs Lab 06/13/14 2315  LIPASE 19    No results for input(s): CKTOTAL, CKMB, CKMBINDEX, TROPONINI in the last 168 hours. BNP (last 3 results)  Recent Labs  06/13/14 1936  BNP 11.0    ProBNP (last 3 results)  Recent Labs  01/31/14 0154  PROBNP 56.3     Radiological Exams  on Admission: Dg Chest 2 View  06/13/2014   CLINICAL DATA:  29 year old female with a history of pain  EXAM: CHEST - 2 VIEW  COMPARISON:  None.  FINDINGS: Cardiomediastinal silhouette projects within normal limits in size and contour. No confluent airspace disease, pneumothorax, or pleural effusion.  No displaced fracture.  Unremarkable appearance of the upper abdomen.  IMPRESSION: No radiographic evidence of acute cardiopulmonary disease.  Signed,  Yvone Neu. Loreta Ave, DO  [facility]  [facility]   Electronically Signed   By: Gilmer MorJaime  Wagner D.O.   On: 06/13/2014 20:22   Ct Angio Chest Pe W/cm &/or Wo Cm  06/14/2014   CLINICAL DATA:  Acute onset of mid chest pain and mild shortness of breath. Chest pain worsens with breathing. Initial encounter.  EXAM: CT ANGIOGRAPHY CHEST WITH CONTRAST  TECHNIQUE: Multidetector CT imaging of the chest was performed using the standard protocol during bolus administration of intravenous contrast. Multiplanar CT image reconstructions and MIPs were obtained to evaluate the vascular anatomy.  CONTRAST:  80mL OMNIPAQUE IOHEXOL 350 MG/ML SOLN  COMPARISON:  Chest radiograph performed 06/13/2014  FINDINGS: A small pulmonary embolus is noted within a pulmonary artery branch to the right lower lobe.  Minimal bilateral atelectasis is noted. The lungs are otherwise clear. There is no evidence of significant focal consolidation, pleural effusion or pneumothorax. No masses are identified; no abnormal focal contrast enhancement is seen.  No mediastinal lymphadenopathy is seen. There is no evidence of pericardial effusion. Residual thymic tissue is within normal limits. The great vessels are unremarkable in appearance. No axillary lymphadenopathy is seen. The visualized portions of the thyroid gland are unremarkable in appearance.  The visualized portions of the liver and spleen are unremarkable.  No acute osseous abnormalities are seen.  Review of  the MIP images confirms the above findings.  IMPRESSION: 1. Small pulmonary embolus noted within a pulmonary artery branch to the right lower lung lobe. 2. Minimal bilateral atelectasis noted; lungs otherwise clear.  Critical Value/emergent results were called by telephone at the time of interpretation on 06/14/2014 at 12:47 am to Dr. Tomasita CrumbleADELEKE ONI, who verbally acknowledged these results.   Electronically Signed   By: Roanna RaiderJeffery  Chang M.D.   On: 06/14/2014 00:48   EKG: Independently reviewed. normal sinus rhythm, nonspecific ST and T waves changes.  Assessment/Plan Principal Problem:   Acute pulmonary embolism Active Problems:   Hypothyroidism   OBESITY, MORBID   1. Acute pulmonary embolism The patient is presenting with complaints of chest pain as well as back pain. She is on oral contraceptive pills. A CT scan of the chest was performed which was positive for small segmental pulmonary embolism. She does not have any history of immobilization. Her family history is unclear. With this the patient is currently being admitted in the hospital. We will start her on Xarelto As per her preference after discussing options about warfarin versus newer oral anticoagulation for long, treatment. She will be monitored on telemetry. We will get lower extremity Doppler in the morning. Will also get hypercoagulable workup.  2. Hypothyroidism. Continuing home Synthroid.  Advance goals of care discussion: Full code   Nutrition: Regular diet  Disposition: Admitted as inpatient, telemetry unit.  Author: Lynden OxfordPranav Aleiah Mohammed, MD [facility] Pager: [phone] 06/14/2014  If 7PM-7AM, please contact night-coverage [email or web address] Password [email or web address]

## 2014-06-14 NOTE — Care Management Note (Signed)
    Page 1 of 1   06/14/2014     2:45:25 PM CARE MANAGEMENT NOTE 06/14/2014  Patient:  Grace Simpson, Grace Simpson   Account Number:  [id number]  Date Initiated:  06/14/2014  Documentation initiated by:  Marvetta Gibbons  Subjective/Objective Assessment:   Pt admitted with PE     Action/Plan:   PTA pt lived at home with spouse   Anticipated DC Date:  06/14/2014   Anticipated DC Plan:  [place]  CM consult  Medication Assistance      Choice offered to / List presented to:             Status of service:  Completed, signed off Medicare Important Message given?   (If response is "NO", the following Medicare IM given date fields will be blank) Date Medicare IM given:   Medicare IM given by:   Date Additional Medicare IM given:   Additional Medicare IM given by:    Discharge Disposition:  HOME/SELF CARE  Per UR Regulation:  Reviewed for med. necessity/level of care/duration of stay  If discussed at [place] of Stay Meetings, dates discussed:    Comments:  06/14/14- [place], BSN [phone] Pt for d/c home today on Xarelto- has Medicaid- Xarelto is preferred drug $3 copay- pt given education started kit- which has 30 day free card in it.

## 2014-06-14 NOTE — Discharge Instructions (Signed)
Information on my medicine - XARELTO (rivaroxaban)  This medication education was reviewed with me or my healthcare representative as part of my discharge preparation.  The pharmacist that spoke with me during my hospital stay was:  Shaleen Talamantez C, RPH  WHY WAS XARELTO PRESCRIBED FOR YOU? Xarelto was prescribed to treat blood clots that may have been found in the veins of your legs (deep vein thrombosis) or in your lungs (pulmonary embolism) and to reduce the risk of them occurring again.  What do you need to know about Xarelto? The starting dose is one 15 mg tablet taken TWICE daily with food for the FIRST 21 DAYS then on (enter date)  May 19th, 2016  the dose is changed to one 20 mg tablet taken ONCE A DAY with your evening meal.  DO NOT stop taking Xarelto without talking to the health care provider who prescribed the medication.  Refill your prescription for 20 mg tablets before you run out.  After discharge, you should have regular check-up appointments with your healthcare provider that is prescribing your Xarelto.  In the future your dose may need to be changed if your kidney function changes by a significant amount.  What do you do if you miss a dose? If you are taking Xarelto TWICE DAILY and you miss a dose, take it as soon as you remember. You may take two 15 mg tablets (total 30 mg) at the same time then resume your regularly scheduled 15 mg twice daily the next day.  If you are taking Xarelto ONCE DAILY and you miss a dose, take it as soon as you remember on the same day then continue your regularly scheduled once daily regimen the next day. Do not take two doses of Xarelto at the same time.   Important Safety Information Xarelto is a blood thinner medicine that can cause bleeding. You should call your healthcare provider right away if you experience any of the following: ? Bleeding from an injury or your nose that does not stop. ? Unusual colored urine (red or dark  brown) or unusual colored stools (red or black). ? Unusual bruising for unknown reasons. ? A serious fall or if you hit your head (even if there is no bleeding).  Some medicines may interact with Xarelto and might increase your risk of bleeding while on Xarelto. To help avoid this, consult your healthcare provider or pharmacist prior to using any new prescription or non-prescription medications, including herbals, vitamins, non-steroidal anti-inflammatory drugs (NSAIDs) and supplements.  This website has more information on Xarelto: [email or web address].

## 2014-06-14 NOTE — Discharge Summary (Signed)
Physician Discharge Summary  Grace Simpson MRN: [id number] DOB/AGE: [birth year] 29 y.o.  PCP: Lynne Logan, MD   Admit date: 06/13/2014 Discharge date: 06/14/2014  Discharge Diagnoses:     Principal Problem:   Acute pulmonary embolism Active Problems:   Hypothyroidism   OBESITY, MORBID   Follow up recommendations Follow up on hypercoagulable panel     Medication List    STOP taking these medications        MICROGESTIN FE 1/20 1-20 MG-MCG tablet  Generic drug:  norethindrone-ethinyl estradiol     naproxen 500 MG tablet  Commonly known as:  NAPROSYN      TAKE these medications        fluticasone 50 MCG/ACT nasal spray  Commonly known as:  FLONASE  Place 2 sprays into both nostrils every morning.     levothyroxine 137 MCG tablet  Commonly known as:  SYNTHROID, LEVOTHROID  Take 137 mcg by mouth daily before breakfast.     loratadine 10 MG tablet  Commonly known as:  CLARITIN  Take 10 mg by mouth daily as needed for allergies.     Rivaroxaban 15 MG Tabs tablet  Commonly known as:  XARELTO  Take 1 tablet (15 mg total) by mouth 2 (two) times daily with a meal.     rivaroxaban 20 MG Tabs tablet  Commonly known as:  XARELTO  Take 1 tablet (20 mg total) by mouth daily with supper.  Start taking on:  07/05/2014     rivaroxaban Kit  Commonly known as:  XARELTO  .        Discharge Condition:  Disposition: 01-Home or Self Care   Consults:  None    Significant Diagnostic Studies: Dg Chest 2 View  06/13/2014   CLINICAL DATA:  29 year old female with a history of pain  EXAM: CHEST - 2 VIEW  COMPARISON:  None.  FINDINGS: Cardiomediastinal silhouette projects within normal limits in size and contour. No confluent airspace disease, pneumothorax, or pleural effusion.  No displaced fracture.  Unremarkable appearance of the upper abdomen.  IMPRESSION: No radiographic evidence of acute cardiopulmonary disease.  Signed,  Dulcy Fanny. Earleen Newport, DO  [facility]  [facility]   Electronically Signed   By: Corrie Mckusick D.O.   On: 06/13/2014 20:22   Ct Angio Chest Pe W/cm &/or Wo Cm  06/14/2014   CLINICAL DATA:  Acute onset of mid chest pain and mild shortness of breath. Chest pain worsens with breathing. Initial encounter.  EXAM: CT ANGIOGRAPHY CHEST WITH CONTRAST  TECHNIQUE: Multidetector CT imaging of the chest was performed using the standard protocol during bolus administration of intravenous contrast. Multiplanar CT image reconstructions and MIPs were obtained to evaluate the vascular anatomy.  CONTRAST:  1m OMNIPAQUE IOHEXOL 350 MG/ML SOLN  COMPARISON:  Chest radiograph performed 06/13/2014  FINDINGS: A small pulmonary embolus is noted within a pulmonary artery branch to the right lower lobe.  Minimal bilateral atelectasis is noted. The lungs are otherwise clear. There is no evidence of significant focal consolidation, pleural effusion or pneumothorax. No masses are identified; no abnormal focal contrast enhancement is seen.  No mediastinal lymphadenopathy is seen. There is no evidence of pericardial effusion. Residual thymic tissue is within normal limits. The great vessels are unremarkable in appearance. No axillary lymphadenopathy is seen. The visualized portions of the thyroid gland are unremarkable in appearance.  The visualized portions of the liver and spleen are unremarkable.  No acute osseous abnormalities are seen.  Review of the MIP images confirms the above findings.  IMPRESSION: 1. Small pulmonary embolus noted within a pulmonary artery branch to the right lower lung lobe. 2. Minimal bilateral atelectasis noted; lungs otherwise clear.  Critical Value/emergent results were called by telephone at the time of interpretation on 06/14/2014 at 12:47 am to Dr. Everlene Balls, who verbally acknowledged these results.   Electronically Signed   By: Garald Balding M.D.   On: 06/14/2014 00:48      Microbiology: No  results found for this or any previous visit (from the past 240 hour(s)).   Labs: Results for orders placed or performed during the hospital encounter of 06/13/14 (from the past 48 hour(s))  CBC     Status: None   Collection Time: 06/13/14  7:36 PM  Result Value Ref Range   WBC 7.9 4.0 - 10.5 K/uL   RBC 4.50 3.87 - 5.11 MIL/uL   Hemoglobin 13.8 12.0 - 15.0 g/dL   HCT 41.1 36.0 - 46.0 %   MCV 91.3 78.0 - 100.0 fL   MCH 30.7 26.0 - 34.0 pg   MCHC 33.6 30.0 - 36.0 g/dL   RDW 13.9 11.5 - 15.5 %   Platelets 357 150 - 400 K/uL  Basic metabolic panel     Status: Abnormal   Collection Time: 06/13/14  7:36 PM  Result Value Ref Range   Sodium 138 135 - 145 mmol/L   Potassium 3.5 3.5 - 5.1 mmol/L   Chloride 107 96 - 112 mmol/L   CO2 21 19 - 32 mmol/L   Glucose, Bld 89 70 - 99 mg/dL   BUN 7 6 - 23 mg/dL   Creatinine, Ser 0.90 0.50 - 1.10 mg/dL   Calcium 9.2 8.4 - 10.5 mg/dL   GFR calc non Af Amer 87 (L) >90 mL/min   GFR calc Af Amer >90 >90 mL/min    Comment: (NOTE) The eGFR has been calculated using the CKD EPI equation. This calculation has not been validated in all clinical situations. eGFR's persistently <90 mL/min signify possible Chronic Kidney Disease.    Anion gap 10 5 - 15  BNP (order ONLY if patient complains of dyspnea/SOB AND you have documented it for THIS visit)     Status: None   Collection Time: 06/13/14  7:36 PM  Result Value Ref Range   B Natriuretic Peptide 11.0 0.0 - 100.0 pg/mL  I-stat troponin, ED (not at [facility])     Status: None   Collection Time: 06/13/14  7:54 PM  Result Value Ref Range   Troponin i, poc 0.00 0.00 - 0.08 ng/mL   Comment 3            Comment: Due to the release kinetics of cTnI, a negative result within the first hours of the onset of symptoms does not rule out myocardial infarction with certainty. If myocardial infarction is still suspected, repeat the test at appropriate intervals.   Hepatic function panel     Status: None   Collection  Time: 06/13/14 11:15 PM  Result Value Ref Range   Total Protein 6.9 6.0 - 8.3 g/dL   Albumin 3.5 3.5 - 5.2 g/dL   AST 16 0 - 37 U/L   ALT 12 0 - 35 U/L   Alkaline Phosphatase 50 39 - 117 U/L   Total Bilirubin 0.7 0.3 - 1.2 mg/dL   Bilirubin, Direct 0.2 0.0 - 0.5 mg/dL   Indirect Bilirubin 0.5 0.3 - 0.9 mg/dL  Lipase, blood  Status: None   Collection Time: 06/13/14 11:15 PM  Result Value Ref Range   Lipase 19 11 - 59 U/L  I-Stat Beta hCG blood, ED ([facility], [facility], [facility] only)     Status: None   Collection Time: 06/13/14 11:26 PM  Result Value Ref Range   I-stat hCG, quantitative <5.0 <5 mIU/mL   Comment 3            Comment:   GEST. AGE      CONC.  (mIU/mL)   <=1 WEEK        5 - 50     2 WEEKS       50 - 500     3 WEEKS       100 - 10,000     4 WEEKS     1,000 - 30,000        FEMALE AND NON-PREGNANT FEMALE:     LESS THAN 5 mIU/mL   Comprehensive metabolic panel     Status: Abnormal   Collection Time: 06/14/14  2:29 AM  Result Value Ref Range   Sodium 138 135 - 145 mmol/L   Potassium 3.2 (L) 3.5 - 5.1 mmol/L   Chloride 108 96 - 112 mmol/L   CO2 22 19 - 32 mmol/L   Glucose, Bld 86 70 - 99 mg/dL   BUN 7 6 - 23 mg/dL   Creatinine, Ser 0.83 0.50 - 1.10 mg/dL   Calcium 8.8 8.4 - 10.5 mg/dL   Total Protein 6.5 6.0 - 8.3 g/dL   Albumin 3.3 (L) 3.5 - 5.2 g/dL   AST 12 0 - 37 U/L   ALT 12 0 - 35 U/L   Alkaline Phosphatase 50 39 - 117 U/L   Total Bilirubin 0.6 0.3 - 1.2 mg/dL   GFR calc non Af Amer >90 >90 mL/min   GFR calc Af Amer >90 >90 mL/min    Comment: (NOTE) The eGFR has been calculated using the CKD EPI equation. This calculation has not been validated in all clinical situations. eGFR's persistently <90 mL/min signify possible Chronic Kidney Disease.    Anion gap 8 5 - 15  CBC     Status: None   Collection Time: 06/14/14  2:29 AM  Result Value Ref Range   WBC 6.9 4.0 - 10.5 K/uL   RBC 4.37 3.87 - 5.11 MIL/uL   Hemoglobin 13.5 12.0 - 15.0 g/dL   HCT 39.9 36.0 - 46.0 %    MCV 91.3 78.0 - 100.0 fL   MCH 30.9 26.0 - 34.0 pg   MCHC 33.8 30.0 - 36.0 g/dL   RDW 13.7 11.5 - 15.5 %   Platelets 339 150 - 400 K/uL  Protime-INR     Status: None   Collection Time: 06/14/14  2:29 AM  Result Value Ref Range   Prothrombin Time 13.6 11.6 - 15.2 seconds   INR 1.03 0.00 - 1.49     HPI :Grace Simpson is a 29 y.o. female with Past medical history of hypothyroidism, obesity. The patient is presenting with complaints of 2 day history of back pain as well as chest pain. The pain is located on the middle of the back and it feels like a sharp stabbing pain. The pain gets worse with deep breath. At its worse it is 10 out of 10. At the time of my evaluation the patient pain was 0. She also had this pain radiating to her left chest and then coming into her center of the chest and  radiating to her right side. She has some shortness of breath as well as dizziness during this morning. She denies any nausea or vomiting. Denies any diarrhea denies any weight loss denies any blood in the stool denies any night sweats denies any cough fever chills. She is taking oral contraceptive pills with a combination of progestin and estrogen. She mentions she is adopted and therefore she does not know her family history.  The patient is coming from home. And at her baseline independent for most of her ADL.  HOSPITAL COURSE:  Acute pulmonary embolism, likely the setting of hormonal therapy for contraception Patient started on Xarelto, I have discontinued   venous Doppler as this would not change management Hypercoagulable workup ordered and pending Patient is adopted and does not know her family history He is to follow-up with PCP continue Xarelto for at least 6 months Switched to nonhormonal contraception advised     Discharge Exam:  Blood pressure 145/97, pulse 80, temperature 99.8 F (37.7 C), temperature source Oral, resp. rate 20, height _0  (1.549 m), weight 127.597 kg (281 lb 4.8  oz), last menstrual period 06/01/2014, SpO2 98 %.  General: Alert, Awake and Oriented to Time, Place and Person. Appear in mild distress Eyes: PERRL ENT: Oral Mucosa clear moist. Neck: no JVD Cardiovascular: S1 and S2 Present, no Murmur, Peripheral Pulses Present Respiratory: Bilateral Air entry equal and Decreased,  Clear to Auscultation, no Crackles, no wheezes Abdomen: Bowel Sound present, Soft and non tender Skin: no Rash Extremities: Trace Pedal edema, no calf tenderness Neurologic: Grossly no focal neuro deficit.        Discharge Instructions    Diet - low sodium heart healthy    Complete by:  As directed      Increase activity slowly    Complete by:  As directed            Follow-up Information    Follow up with Lynne Logan, MD. Schedule an appointment as soon as possible for a visit in 3 days.   Specialty:  Family Medicine   Contact information:   [address] [place] [address] [phone]       Signed: Reyne Dumas 06/14/2014, 11:30 AM

## 2014-06-14 NOTE — ED Notes (Signed)
Patient transported to CT 

## 2014-06-14 NOTE — Progress Notes (Signed)
ANTICOAGULATION CONSULT NOTE - Initial Consult  Pharmacy Consult for Xarelto Indication: pulmonary embolus  Allergies  Allergen Reactions  . Corticosteroids Other (See Comments)    Adverse effect, jitttery  . Food     Melon-swelling (lips/tongue) itching  . Diphenhydramine Hcl Other (See Comments)    Adverse effect. jittery  . Sulfa Antibiotics Hives    Patient Measurements: Height: 5\' 1"  (154.9 cm) Weight: 280 lb (127.007 kg) IBW/kg (Calculated) : 47.8  Vital Signs: Temp: 99.1 F (37.3 C) (04/27 1927) BP: 150/93 mmHg (04/28 0130) Pulse Rate: 75 (04/28 0130)  Labs:  Recent Labs  06/13/14 1936  HGB 13.8  HCT 41.1  PLT 357  CREATININE 0.90    Estimated Creatinine Clearance: 118.9 mL/min (by C-G formula based on Cr of 0.9).   Medical History: Past Medical History  Diagnosis Date  . Hypothyroidism   . Obesity, morbid (more than 100 lbs over ideal weight or BMI > 40)   . Bipolar affective   . OCD (obsessive compulsive disorder)   . Oppositional defiant disorder   . Herpes     last outbreak 4 months ago  . Hyperprolactinemia   . GERD (gastroesophageal reflux disease)   . Left breast abscess     not MRSA  . UTI (lower urinary tract infection)   . Trichomonas   . BV (bacterial vaginosis)   . Hemorrhoids   . Hypertension   . Pregnancy induced hypertension     unsure if PIH or CHTN - no meds  . Anxiety     no meds currently  . Obstructive sleep apnea     does not use CPAP  . Seasonal allergies   . Pyelonephritis     history - no current problem  . S/P cesarean section 10/23/2012  . Headache(784.0)   . Migraines   . Anxiety    Assessment: 29 y/o F with CP/shortness of breath/nausea, found to have small PE on CT, CBC/renal function good, other labs as above.   Goal of Therapy:  Monitor platelets by anticoagulation protocol: Yes   Plan:  -Xarelto 15 mg BID with meals for 21 days, then 20 mg daily with supper -Minimum q72h CBC while  inpatient -Monitor for bleeding  Abran DukeLedford, Jebidiah Baggerly 06/14/2014,2:35 AM

## 2014-06-14 NOTE — ED Notes (Signed)
Admitting MD at bedside.

## 2014-06-15 LAB — HOMOCYSTEINE: Homocysteine: 7.2 umol/L (ref 0.0–15.0)

## 2014-06-16 LAB — CARDIOLIPIN ANTIBODIES, IGG, IGM, IGA
Anticardiolipin IgG: <9 GPL U/mL (ref 0–14)
Anticardiolipin IgM: <9 MPL U/mL (ref 0–12)

## 2014-06-16 LAB — LUPUS ANTICOAGULANT PANEL
DRVVT: 37.5 s (ref 0.0–55.1)
PTT LA: 41.9 s (ref 0.0–50.0)

## 2014-06-17 LAB — BETA-2-GLYCOPROTEIN I ABS, IGG/M/A
Beta-2 Glyco I IgG: <9 GPI IgG units (ref 0–20)
Beta-2-Glycoprotein I IgA: <9 GPI IgA units (ref 0–25)
Beta-2-Glycoprotein I IgM: <9 GPI IgM units (ref 0–32)

## 2014-06-18 ENCOUNTER — Ambulatory Visit (INDEPENDENT_AMBULATORY_CARE_PROVIDER_SITE_OTHER): Payer: Medicaid Other | Admitting: Podiatry

## 2014-06-18 ENCOUNTER — Encounter: Payer: Self-pay | Admitting: Podiatry

## 2014-06-18 VITALS — BP 160/76 | HR 88 | Resp 16

## 2014-06-18 DIAGNOSIS — M722 Plantar fascial fibromatosis: Secondary | ICD-10-CM | POA: Diagnosis not present

## 2014-06-18 DIAGNOSIS — M779 Enthesopathy, unspecified: Secondary | ICD-10-CM

## 2014-06-18 LAB — FACTOR 5 LEIDEN

## 2014-06-18 MED ORDER — TRIAMCINOLONE ACETONIDE 10 MG/ML IJ SUSP
10.0000 mg | Freq: Once | INTRAMUSCULAR | Status: AC
  Administered 2014-06-18: 10 mg

## 2014-06-19 NOTE — Progress Notes (Signed)
Subjective:     Patient ID: Grace Simpson, female   DOB: 06/23/1985, 29 y.o.   MRN: [id number]  HPI patient presents stating my ankle feels better but I'm still having pain underneath my first metatarsal with ambulation. Patient stated she developed a small blood clot in her long in the last few weeks and is now on xaralto   Review of Systems     Objective:   Physical Exam Neurovascular status intact with quite a bit of discomfort in the plantar aspect of the sesamoidal complex still around tibial but also mildly around the fibular sesamoidal complex    Assessment:     Continued inflammatory sesamoiditis right    Plan:     Careful injection of the lateral side from a dorsal approach 3 mg Kenalog 5 mg Xylocaine and a orthotic scanning was done at this time

## 2014-06-20 LAB — PROTHROMBIN GENE MUTATION

## 2014-06-22 ENCOUNTER — Ambulatory Visit
Admission: RE | Admit: 2014-06-22 | Discharge: 2014-06-22 | Disposition: A | Discharge status: Home / Self Care | Payer: Medicaid Other | Source: Ambulatory Visit | Attending: General Surgery | Admitting: General Surgery

## 2014-06-22 ENCOUNTER — Ambulatory Visit
Admit: 2014-06-22 | Discharge: 2014-06-22 | Disposition: A | Discharge status: Home / Self Care | Payer: Medicaid Other | Attending: General Surgery | Admitting: General Surgery

## 2014-06-22 ENCOUNTER — Ambulatory Visit: Admit: 2014-06-22 | Payer: Medicaid Other

## 2014-06-22 DIAGNOSIS — N611 Abscess of the breast and nipple: Secondary | ICD-10-CM

## 2014-08-09 ENCOUNTER — Telehealth: Payer: Self-pay | Admitting: Podiatry

## 2014-08-09 NOTE — Telephone Encounter (Signed)
Patient called wanting to know the status of her orthotics. York Spaniel she was seen around May 2nd and still has not heard anything.

## 2014-08-14 NOTE — Telephone Encounter (Signed)
Called and left message for patient to call the office and schedule an appointment to pick up orthotics.

## 2014-08-22 ENCOUNTER — Emergency Department (HOSPITAL_COMMUNITY)
Admission: EM | Admit: 2014-08-22 | Discharge: 2014-08-22 | Disposition: A | Discharge status: Home / Self Care | Payer: Medicaid Other | Attending: Emergency Medicine | Admitting: Emergency Medicine

## 2014-08-22 ENCOUNTER — Emergency Department (HOSPITAL_COMMUNITY): Payer: Medicaid Other

## 2014-08-22 ENCOUNTER — Encounter (HOSPITAL_COMMUNITY): Payer: Self-pay | Admitting: Emergency Medicine

## 2014-08-22 DIAGNOSIS — Z8659 Personal history of other mental and behavioral disorders: Secondary | ICD-10-CM | POA: Insufficient documentation

## 2014-08-22 DIAGNOSIS — E039 Hypothyroidism, unspecified: Secondary | ICD-10-CM | POA: Diagnosis not present

## 2014-08-22 DIAGNOSIS — Z3202 Encounter for pregnancy test, result negative: Secondary | ICD-10-CM | POA: Insufficient documentation

## 2014-08-22 DIAGNOSIS — Z87891 Personal history of nicotine dependence: Secondary | ICD-10-CM | POA: Diagnosis not present

## 2014-08-22 DIAGNOSIS — R079 Chest pain, unspecified: Secondary | ICD-10-CM | POA: Diagnosis not present

## 2014-08-22 DIAGNOSIS — Z8742 Personal history of other diseases of the female genital tract: Secondary | ICD-10-CM | POA: Insufficient documentation

## 2014-08-22 DIAGNOSIS — R0600 Dyspnea, unspecified: Secondary | ICD-10-CM | POA: Diagnosis not present

## 2014-08-22 DIAGNOSIS — I10 Essential (primary) hypertension: Secondary | ICD-10-CM | POA: Insufficient documentation

## 2014-08-22 DIAGNOSIS — Z8669 Personal history of other diseases of the nervous system and sense organs: Secondary | ICD-10-CM | POA: Insufficient documentation

## 2014-08-22 DIAGNOSIS — Z8719 Personal history of other diseases of the digestive system: Secondary | ICD-10-CM | POA: Insufficient documentation

## 2014-08-22 DIAGNOSIS — R0602 Shortness of breath: Secondary | ICD-10-CM | POA: Diagnosis present

## 2014-08-22 DIAGNOSIS — Z79899 Other long term (current) drug therapy: Secondary | ICD-10-CM | POA: Diagnosis not present

## 2014-08-22 DIAGNOSIS — R509 Fever, unspecified: Secondary | ICD-10-CM | POA: Insufficient documentation

## 2014-08-22 DIAGNOSIS — Z8619 Personal history of other infectious and parasitic diseases: Secondary | ICD-10-CM | POA: Insufficient documentation

## 2014-08-22 DIAGNOSIS — Z87440 Personal history of urinary (tract) infections: Secondary | ICD-10-CM | POA: Insufficient documentation

## 2014-08-22 DIAGNOSIS — R000 Tachycardia, unspecified: Secondary | ICD-10-CM | POA: Diagnosis not present

## 2014-08-22 DIAGNOSIS — Z87448 Personal history of other diseases of urinary system: Secondary | ICD-10-CM | POA: Diagnosis not present

## 2014-08-22 LAB — CBC WITH DIFFERENTIAL/PLATELET
Basophils Absolute: 0 10*3/uL (ref 0.0–0.1)
Basophils Relative: 0 % (ref 0–1)
Eosinophils Absolute: 0 10*3/uL (ref 0.0–0.7)
Eosinophils Relative: 0 % (ref 0–5)
HCT: 41.2 % (ref 36.0–46.0)
Hemoglobin: 13.7 g/dL (ref 12.0–15.0)
Lymphocytes Relative: 25 % (ref 12–46)
Lymphs Abs: 2 10*3/uL (ref 0.7–4.0)
MCH: 30.1 pg (ref 26.0–34.0)
MCHC: 33.3 g/dL (ref 30.0–36.0)
MCV: 90.5 fL (ref 78.0–100.0)
Monocytes Absolute: 0.3 10*3/uL (ref 0.1–1.0)
Monocytes Relative: 4 % (ref 3–12)
Neutro Abs: 5.5 10*3/uL (ref 1.7–7.7)
Neutrophils Relative %: 71 % (ref 43–77)
Platelets: 357 10*3/uL (ref 150–400)
RBC: 4.55 MIL/uL (ref 3.87–5.11)
RDW: 13.4 % (ref 11.5–15.5)
WBC: 7.8 10*3/uL (ref 4.0–10.5)

## 2014-08-22 LAB — BASIC METABOLIC PANEL
Anion gap: 10 (ref 5–15)
BUN: 11 mg/dL (ref 6–20)
CO2: 21 mmol/L — ABNORMAL LOW (ref 22–32)
Calcium: 9.6 mg/dL (ref 8.9–10.3)
Chloride: 106 mmol/L (ref 101–111)
Creatinine, Ser: 0.83 mg/dL (ref 0.44–1.00)
GFR calc Af Amer: >60 mL/min (ref >60)
GFR calc non Af Amer: >60 mL/min (ref >60)
Glucose, Bld: 103 mg/dL — ABNORMAL HIGH (ref 65–99)
Potassium: 3.6 mmol/L (ref 3.5–5.1)
Sodium: 137 mmol/L (ref 135–145)

## 2014-08-22 LAB — POC URINE PREG, ED: Preg Test, Ur: NEGATIVE

## 2014-08-22 MED ORDER — ACETAMINOPHEN 500 MG PO TABS
1000.0000 mg | ORAL_TABLET | Freq: Once | ORAL | Status: AC
  Administered 2014-08-22: 1000 mg via ORAL
  Filled 2014-08-22: qty 2

## 2014-08-22 MED ORDER — MORPHINE SULFATE 4 MG/ML IJ SOLN
6.0000 mg | Freq: Once | INTRAMUSCULAR | Status: DC
  Filled 2014-08-22: qty 2

## 2014-08-22 MED ORDER — IOHEXOL 350 MG/ML SOLN
100.0000 mL | Freq: Once | INTRAVENOUS | Status: AC | PRN
  Administered 2014-08-22: 100 mL via INTRAVENOUS

## 2014-08-22 NOTE — ED Notes (Signed)
DOCUMENTATION ERROR NO MEDICAL TRANSFER FOR THIS PT. DOCUMENTED ON WRONG PT. PTAR NOT CALLED FOR THIS PT

## 2014-08-22 NOTE — ED Provider Notes (Signed)
CSN: [id number]     Arrival date & time 08/22/14  1259 History   First MD Initiated Contact with Patient 08/22/14 1306     Chief Complaint  Patient presents with  . Shortness of Breath  . Previous Pulmonary Embolism      (Consider location/radiation/quality/duration/timing/severity/associated sxs/prior Treatment) HPI   29 year old female with chest pain. Pain is in the center chest to the right side with some radiation to her back. Sharp. Worse with certain movements and deep breathing. Onset about 3 days ago. Persistent since then. Subjective fever. Shortness of breath which is worse with exertion. Patient has a past history of pulmonary embolism diagnosed on CT 06/14/2014.Marland Kitchen She is currently on Xarelto. Felt to be precipitated by exogenous estrogen use. Patient additionally reports intermittent hemoptysis for the past several weeks. She typically notices this in the morning when she wakes up. This has not acutely worsened over last few days.   Past Medical History  Diagnosis Date  . Hypothyroidism   . Obesity, morbid (more than 100 lbs over ideal weight or BMI > 40)   . Bipolar affective   . OCD (obsessive compulsive disorder)   . Oppositional defiant disorder   . Herpes     last outbreak 4 months ago  . Hyperprolactinemia   . GERD (gastroesophageal reflux disease)   . Left breast abscess     not MRSA  . UTI (lower urinary tract infection)   . Trichomonas   . BV (bacterial vaginosis)   . Hemorrhoids   . Hypertension   . Pregnancy induced hypertension     unsure if PIH or CHTN - no meds  . Anxiety     no meds currently  . Obstructive sleep apnea     does not use CPAP  . Seasonal allergies   . Pyelonephritis     history - no current problem  . S/P cesarean section 10/23/2012  . Headache(784.0)   . Migraines   . Anxiety    Past Surgical History  Procedure Laterality Date  . Tonsillectomy  1997  . Left breast abcess Left 2010  . Cholecystectomy  2009  . Adenoidectomy     . Cesarean section  11/20/2010    Procedure: CESAREAN SECTION;  Surgeon: Logan Bores;  Location: [facility];  Service: Gynecology;  Laterality: N/A;  . Wisdom tooth extraction    . Cesarean section N/A 10/21/2012    Procedure: REPEAT CESAREAN SECTION;  Surgeon: Logan Bores, MD;  Location: [facility];  Service: Obstetrics;  Laterality: N/A;   Family History  Problem Relation Age of Onset  . Adopted: Yes  . Family history unknown: Yes   History  Substance Use Topics  . Smoking status: Former Smoker -- 0.50 packs/day    Types: Cigarettes    Quit date: 03/01/2012  . Smokeless tobacco: Never Used  . Alcohol Use: No   OB History    Gravida Para Term Preterm AB TAB SAB Ectopic Multiple Living   '2 2 2 ' 0 0 0 0 0 0 2     Review of Systems  All systems reviewed and negative, other than as noted in HPI.   Allergies  Corticosteroids; Food; Diphenhydramine hcl; and Sulfa antibiotics  Home Medications   Prior to Admission medications   Medication Sig Start Date End Date Taking? Authorizing Provider  fluticasone (FLONASE) 50 MCG/ACT nasal spray Place 2 sprays into both nostrils every morning.   Yes Historical Provider, MD  levothyroxine (SYNTHROID, LEVOTHROID) 137 MCG tablet Take  137 mcg by mouth daily before breakfast.   Yes Historical Provider, MD  rivaroxaban (XARELTO) 20 MG TABS tablet Take 1 tablet (20 mg total) by mouth daily with supper. 07/05/14  Yes Reyne Dumas, MD  Rivaroxaban (XARELTO) 15 MG TABS tablet Take 1 tablet (15 mg total) by mouth 2 (two) times daily with a meal. Patient not taking: Reported on 08/22/2014 06/14/14 08/22/14  Reyne Dumas, MD  rivaroxaban (XARELTO) KIT . Patient not taking: Reported on 08/22/2014 06/14/14   Reyne Dumas, MD   BP 138/76 mmHg  Pulse 106  Temp(Src) 100.5 F (38.1 C) (Oral)  Resp 20  Ht '5\' 1"'  (1.549 m)  Wt 275 lb (124.739 kg)  BMI 51.99 kg/m2  SpO2 100%  LMP 07/13/2014 (Approximate)  Breastfeeding? No Physical Exam  Constitutional:  She appears well-developed and well-nourished. No distress.  HENT:  Head: Normocephalic and atraumatic.  Eyes: Conjunctivae are normal. Right eye exhibits no discharge. Left eye exhibits no discharge.  Neck: Neck supple.  Cardiovascular: Regular rhythm and normal heart sounds.  Exam reveals no gallop and no friction rub.   No murmur heard. Mild tachycardia  Pulmonary/Chest: Effort normal and breath sounds normal. No respiratory distress.  Abdominal: Soft. She exhibits no distension. There is no tenderness.  Musculoskeletal: She exhibits no edema or tenderness.  Lower extremities symmetric as compared to each other. No calf tenderness. Negative Homan's. No palpable cords.   Neurological: She is alert.  Skin: Skin is warm and dry.  Psychiatric: She has a normal mood and affect. Her behavior is normal. Thought content normal.  Nursing note and vitals reviewed.   ED Course  Procedures (including critical care time) Labs Review Labs Reviewed  BASIC METABOLIC PANEL - Abnormal; Notable for the following:    CO2 21 (*)    Glucose, Bld 103 (*)    All other components within normal limits  CBC WITH DIFFERENTIAL/PLATELET  POC URINE PREG, ED    Imaging Review Dg Chest 2 View  08/22/2014   CLINICAL DATA:  Shortness of breath, nausea, cough and congestion for 2 days.  EXAM: CHEST  2 VIEW  COMPARISON:  CT chest 06/14/2014 and PA and lateral chest 06/13/2014.  FINDINGS: Heart size and mediastinal contours are within normal limits. Both lungs are clear. Visualized skeletal structures are unremarkable.  IMPRESSION: Negative exam.   Electronically Signed   By: Inge Rise M.D.   On: 08/22/2014 14:29   Ct Angio Chest Pe W/cm &/or Wo Cm  08/22/2014   CLINICAL DATA:  Chest pain and shortness of breath for 2 days. History of pulmonary embolism.  EXAM: CT ANGIOGRAPHY CHEST WITH CONTRAST  TECHNIQUE: Multidetector CT imaging of the chest was performed using the standard protocol during bolus  administration of intravenous contrast. Multiplanar CT image reconstructions and MIPs were obtained to evaluate the vascular anatomy.  CONTRAST:  126m OMNIPAQUE IOHEXOL 350 MG/ML SOLN  COMPARISON:  06/14/2014  FINDINGS: No pulmonary arterial emboli are identified, although subsegmental branches are suboptimally evaluated due to suboptimal contrast opacification and patient body habitus.  No enlarged axillary, mediastinal, or hilar lymph nodes are identified. A small amount of soft tissue in the anterior superior mediastinum is unchanged and likely represents residual thymus. There is no pleural or pericardial effusion. No lung consolidation or nodules are identified. Major airways are patent.  Visualized portion of the upper abdomen is unremarkable. No acute osseous abnormality is seen.  Review of the MIP images confirms the above findings.  IMPRESSION: No pulmonary emboli identified,  with suboptimal evaluation of subsegmental branches.   Electronically Signed   By: Logan Bores   On: 08/22/2014 16:07     EKG Interpretation   Date/Time:  Wednesday August 22 2014 13:06:26 EDT Ventricular Rate:  101 PR Interval:  118 QRS Duration: 79 QT Interval:  326 QTC Calculation: 422 R Axis:   49 Text Interpretation:  Sinus tachycardia Since last tracing rate faster  Confirmed by KNAPP  MD-J, JON ([id number]) on 08/22/2014 1:11:38 PM      MDM   Final diagnoses:  Dyspnea  Fever, unspecified fever cause    29 year old female with dyspnea and chest pain. Atypical for ACS. EKG without acute concerning changes. Febrile to 100.5. Chest x-ray without infiltrate. Patient has a past history of pulmonary embolism. She reports compliance with her Xarelto though. Will CT to further evaluate for either PE or occult pneumonia. Less likely pericarditis. No classic EKG changes. Not positional. No rub appreciated.   CT negative. Viral illness? Results reviewed with patient. No additional complaints. It has been determined that  no acute conditions requiring further emergency intervention are present at this time. The patient has been advised of the diagnosis and plan. I reviewed any labs and imaging including any potential incidental findings. We have discussed signs and symptoms that warrant return to the ED and they are listed in the discharge instructions.      Virgel Manifold, MD 08/23/14 [phone]

## 2014-08-22 NOTE — ED Notes (Signed)
MD at bedside. EDP KOHUT 

## 2014-08-22 NOTE — ED Notes (Signed)
Patient transported to X-ray 

## 2014-08-22 NOTE — ED Notes (Signed)
Pt verbalized understanding discharge instructions. In no acute distress. Pt educated on follow-up.

## 2014-08-22 NOTE — ED Notes (Addendum)
Pt was hospitalized in April for a pulmonary embolism in the right lung. Was supposed to be re-scanned before discharge but says, "I didn't have the best doctor at [facility] so I wasn't re-scanned." Says they thought she had DVTs in her legs but her no doppler was completed for either leg during her hospitalization. Started on Xarelto in the hospital and is now on 20 mg/day. SOB started "a few days ago" and has increasingly gotten worse. Having chest pain around bilateral breast bones-denies radiation at this time. Endorses nausea and congestion and says, "It feels like there is fluid in my lungs when I breathe in." Reports swelling in bilateral legs but mostly in the right. Has not had follow up with vascular but has followed up with PCP recently. Denies diarrhea or fevers. No other c/c.

## 2014-12-03 ENCOUNTER — Ambulatory Visit (INDEPENDENT_AMBULATORY_CARE_PROVIDER_SITE_OTHER): Payer: Medicaid Other | Admitting: Allergy and Immunology

## 2014-12-03 ENCOUNTER — Encounter: Payer: Self-pay | Admitting: Allergy and Immunology

## 2014-12-03 VITALS — BP 140/85 | HR 89 | Temp 99.1°F | Resp 15

## 2014-12-03 DIAGNOSIS — K219 Gastro-esophageal reflux disease without esophagitis: Secondary | ICD-10-CM

## 2014-12-03 DIAGNOSIS — J4541 Moderate persistent asthma with (acute) exacerbation: Secondary | ICD-10-CM

## 2014-12-03 DIAGNOSIS — J3089 Other allergic rhinitis: Secondary | ICD-10-CM

## 2014-12-03 DIAGNOSIS — J4540 Moderate persistent asthma, uncomplicated: Secondary | ICD-10-CM | POA: Insufficient documentation

## 2014-12-03 MED ORDER — METHYLPREDNISOLONE ACETATE 80 MG/ML IJ SUSP: 80.0000 mg | Freq: Once | INTRAMUSCULAR | Status: DC

## 2014-12-03 MED ORDER — METHYLPREDNISOLONE ACETATE PF 80 MG/ML IJ SUSP
80.0000 mg | Freq: Once | INTRAMUSCULAR | Status: AC
  Administered 2014-12-03: 80 mg via INTRAMUSCULAR

## 2014-12-03 MED ORDER — OMEPRAZOLE 40 MG PO CPDR: 40.0000 mg | DELAYED_RELEASE_CAPSULE | Freq: Every day | ORAL | Status: DC

## 2014-12-03 MED ORDER — PREDNISONE 1 MG PO TABS: 10.0000 mg | ORAL_TABLET | Freq: Every day | ORAL | Status: AC

## 2014-12-03 MED ORDER — LEVALBUTEROL HCL 1.25 MG/3ML IN NEBU
1.2500 mg | INHALATION_SOLUTION | Freq: Once | RESPIRATORY_TRACT | Status: AC
  Administered 2014-12-03: 1.25 mg via RESPIRATORY_TRACT

## 2014-12-03 MED ORDER — PREDNISONE 10 MG (21) PO TBPK: 10.0000 mg | ORAL_TABLET | ORAL | Status: DC

## 2014-12-03 MED ORDER — IPRATROPIUM BROMIDE 0.02 % IN SOLN
0.5000 mg | Freq: Once | RESPIRATORY_TRACT | Status: AC
  Administered 2014-12-03: 0.5 mg via RESPIRATORY_TRACT

## 2014-12-03 MED ORDER — BUDESONIDE-FORMOTEROL FUMARATE 160-4.5 MCG/ACT IN AERO: 2.0000 | INHALATION_SPRAY | Freq: Two times a day (BID) | RESPIRATORY_TRACT | Status: DC

## 2014-12-03 NOTE — Progress Notes (Signed)
History of present illness: HPI Comments: This 29 year old female with persistent asthma, allergic rhinitis, and gastroesophageal reflux disease presents today for follow up. Grace Simpson reports that over the past 2 weeks her asthma has been poorly controlled.  She has experienced frequent dyspnea and wheezing with mild/moderate exertion as well as at rest.  She has required albuterol rescue 2 or 3 times per day on average.  She denies nocturnal awakenings due to lower respiratory symptoms.  She recently had sinusitis, strep throat, and bilateral otitis media.  She was started on antibiotics with improvement of her nasal/sinus/otic symptoms, however her asthma symptoms have persisted.  In addition, she notes that she ran out of omeprazole approximately 3 weeks ago.  She denies current fevers or chills.  Safaa notes that she experiences teary hands and nervousness after using albuterol.   Assessment and plan: Moderate persistent asthma with exacerbation  Depo-Medrol 80 mg was administered in the office.  Prednisone has been provided and is to be started tomorrow as follows: 20 mg daily x 4 days, 10 mg x1 day, then stop.  A prescription has been provided for Symbicort (budesonide/formoterol) 160/4.5 g, 2 inhalations via spacer device twice a day.  Discontinue Qvar.  A prescription has been provided for levalbuterol, 1-2 inhalations via spacer device every 4-6 hours as needed and 15 minutes prior to exercise.  The patient has been asked to contact me if her symptoms persist, progress, or if she becomes febrile. Otherwise, she may return for follow up in 3 months.    GERD (gastroesophageal reflux disease)  Restart omeprazole 40 mg daily.  A refill prescription has been provided.  Other allergic rhinitis  Continue allergen avoidance, azelastine nasal spray as needed, fluticasone nasal spray as needed, and nasal saline lavage as needed.    Medications ordered this encounter: Meds ordered  this encounter  Medications  . levalbuterol (XOPENEX) nebulizer solution 1.25 mg    Sig:   . ipratropium (ATROVENT) nebulizer solution 0.5 mg    Sig:   . budesonide-formoterol (SYMBICORT) 160-4.5 MCG/ACT inhaler    Sig: Inhale 2 puffs into the lungs 2 (two) times daily.    Dispense:  1 Inhaler    Refill:  12  . omeprazole (PRILOSEC) 40 MG capsule    Sig: Take 1 capsule (40 mg total) by mouth daily.    Dispense:  30 capsule    Refill:  3  . methylPREDNISolone acetate (DEPO-MEDROL) 80 MG/ML injection    Sig: Inject 1 mL (80 mg total) into the muscle once.    Dispense:  1 mL    Refill:  0  . predniSONE (STERAPRED UNI-PAK 21 TAB) 10 MG (21) TBPK tablet    Sig: Take 1 tablet (10 mg total) by mouth as directed.    Dispense:  21 tablet    Refill:  0    Diagnositics: Spirometry: FVC was 2.68 L (92% predicted) and FEV1 was 2.28 L (90% predicted) with significant (360 mL, 16%) postbronchodilator improvement.     Physical examination: Blood pressure 140/85, pulse 89, temperature 99.1 F (37.3 C), temperature source Oral, resp. rate 15.  General: Alert, interactive, in no acute distress. HEENT: TMs pearly gray, turbinates moderately edematous with clear discharge, post-pharynx moderately erythematous. Neck: Supple without lymphadenopathy. Lungs:  Decreased breath sounds bilaterally without wheezing, rhonchi or rales. CV: Normal S1, S2 without murmurs. Skin: Warm and dry, without lesions or rashes.  The following portions of the patient's history were reviewed and updated as appropriate: allergies, current medications, past  family history, past medical history, past social history, past surgical history and problem list.  Outpatient medications:   Medication List       This list is accurate as of: 12/03/14  5:03 PM.  Always use your most recent med list.               amoxicillin 875 MG tablet  Commonly known as:  AMOXIL  TK 1 T PO  Q 12 H FOR 10 DAYS     Azelastine HCl  0.15 % Soln  INSTILL 2 SPRAYS IEN BID FOR STUFFY NOSE OR DRAINAGE     beclomethasone 40 MCG/ACT inhaler  Commonly known as:  QVAR  Inhale 2 puffs into the lungs 2 (two) times daily.     budesonide-formoterol 160-4.5 MCG/ACT inhaler  Commonly known as:  SYMBICORT  Inhale 2 puffs into the lungs 2 (two) times daily.     fluticasone 50 MCG/ACT nasal spray  Commonly known as:  FLONASE  Place 2 sprays into both nostrils every morning.     levothyroxine 137 MCG tablet  Commonly known as:  SYNTHROID, LEVOTHROID  Take 137 mcg by mouth daily before breakfast.     methylPREDNISolone acetate 80 MG/ML injection  Commonly known as:  DEPO-MEDROL  Inject 1 mL (80 mg total) into the muscle once.     omeprazole 40 MG capsule  Commonly known as:  PRILOSEC  Take 1 capsule (40 mg total) by mouth daily.     predniSONE 10 MG (21) Tbpk tablet  Commonly known as:  STERAPRED UNI-PAK 21 TAB  Take 1 tablet (10 mg total) by mouth as directed.     Rivaroxaban 15 MG Tabs tablet  Commonly known as:  XARELTO  Take 1 tablet (15 mg total) by mouth 2 (two) times daily with a meal.     rivaroxaban 20 MG Tabs tablet  Commonly known as:  XARELTO  Take 1 tablet (20 mg total) by mouth daily with supper.     rivaroxaban Kit  Commonly known as:  XARELTO  .        Known medication allergies: Allergies  Allergen Reactions  . Corticosteroids Other (See Comments)    Adverse effect, jitttery  . Food     Melon-swelling (lips/tongue) itching  . Diphenhydramine Hcl Other (See Comments)    Adverse effect. jittery  . Sulfa Antibiotics Hives    I appreciate the opportunity to take part in this [place] care. Please do not hesitate to contact me with questions.  Sincerely,   R. Edgar Frisk, MD

## 2014-12-03 NOTE — Patient Instructions (Addendum)
Moderate persistent asthma with exacerbation  Depo-Medrol 80 mg was administered in the office.  Prednisone has been provided and is to be started tomorrow as follows: 20 mg daily x 4 days, 10 mg x1 day, then stop.  A prescription has been provided for Symbicort (budesonide/formoterol) 160/4.5 g, 2 inhalations via spacer device twice a day.  Discontinue Qvar.  The patient has been asked to contact me if her symptoms persist, progress, or if she becomes febrile. Otherwise, she may return for follow up in 3 months.    GERD (gastroesophageal reflux disease)  Restart omeprazole 40 mg daily.  A refill prescription has been provided.  Other allergic rhinitis  Continue allergen avoidance, azelastine nasal spray as needed, fluticasone nasal spray as needed, and nasal saline lavage as needed.    Return in about 4 months (around 04/05/2015), or if symptoms worsen or fail to improve.

## 2014-12-03 NOTE — Assessment & Plan Note (Signed)
   Restart omeprazole 40 mg daily.  A refill prescription has been provided.

## 2014-12-03 NOTE — Assessment & Plan Note (Addendum)
   Depo-Medrol 80 mg was administered in the office.  Prednisone has been provided and is to be started tomorrow as follows: 20 mg daily x 4 days, 10 mg x1 day, then stop.  A prescription has been provided for Symbicort (budesonide/formoterol) 160/4.5 g, 2 inhalations via spacer device twice a day.  Discontinue Qvar.  A prescription has been provided for levalbuterol, 1-2 inhalations via spacer device every 4-6 hours as needed and 15 minutes prior to exercise.  The patient has been asked to contact me if her symptoms persist, progress, or if she becomes febrile. Otherwise, she may return for follow up in 3 months.

## 2014-12-03 NOTE — Assessment & Plan Note (Signed)
   Continue allergen avoidance, azelastine nasal spray as needed, fluticasone nasal spray as needed, and nasal saline lavage as needed.

## 2015-01-01 ENCOUNTER — Telehealth: Payer: Self-pay | Admitting: Allergy and Immunology

## 2015-01-01 NOTE — Telephone Encounter (Signed)
Pt was prescribed Symbicort on last visit and said she is not doing well on this medication. Pt would like to know if there is an alternative? Please call back at number listed in chart.

## 2015-01-03 NOTE — Telephone Encounter (Signed)
Patient called this morning and had not heard from us regarding her Symbicort that was prescribed on last visit. Patient said she was not doing well on this medication.wanted to know if there is an alternative. Please call patient back at [phone]

## 2015-01-04 ENCOUNTER — Ambulatory Visit (INDEPENDENT_AMBULATORY_CARE_PROVIDER_SITE_OTHER): Payer: Medicaid Other | Admitting: Internal Medicine

## 2015-01-04 ENCOUNTER — Encounter: Payer: Self-pay | Admitting: Internal Medicine

## 2015-01-04 VITALS — BP 154/84 | HR 85 | Temp 98.1°F | Resp 20 | Ht 61.0 in | Wt 289.0 lb

## 2015-01-04 DIAGNOSIS — J4540 Moderate persistent asthma, uncomplicated: Secondary | ICD-10-CM

## 2015-01-04 DIAGNOSIS — J310 Chronic rhinitis: Secondary | ICD-10-CM | POA: Insufficient documentation

## 2015-01-04 MED ORDER — LEVALBUTEROL TARTRATE 45 MCG/ACT IN AERO: 2.0000 | INHALATION_SPRAY | RESPIRATORY_TRACT | Status: DC | PRN

## 2015-01-04 MED ORDER — LEVALBUTEROL HCL 1.25 MG/3ML IN NEBU: 1.2500 mg | INHALATION_SOLUTION | RESPIRATORY_TRACT | Status: DC | PRN

## 2015-01-04 MED ORDER — BECLOMETHASONE DIPROPIONATE 80 MCG/ACT IN AERS: 2.0000 | INHALATION_SPRAY | Freq: Two times a day (BID) | RESPIRATORY_TRACT | Status: DC

## 2015-01-04 NOTE — Assessment & Plan Note (Signed)
   Currently well controlled  Continue loratadine, azelastine, fluticasone

## 2015-01-04 NOTE — Addendum Note (Signed)
Addended by: Isaias CowmanUGLAS, Yacine Garriga M on: 01/04/2015 04:19 PM   Modules accepted: Orders

## 2015-01-04 NOTE — Assessment & Plan Note (Signed)
   Because of increased anxiety associated with Symbicort, I will switch her to Qvar 80 mg 2 puffs twice a day with spacer  Start Xopenex HFA as needed  Because panic attacks and anxiety symptoms can coincide with asthma symptoms, she was asked to discuss this with her primary care provider.

## 2015-01-04 NOTE — Patient Instructions (Signed)
Moderate persistent asthma  Because of increased anxiety associated with Symbicort, I will switch her to Qvar 80 mg 2 puffs twice a day with spacer  Start Xopenex HFA as needed  Because panic attacks and anxiety symptoms can coincide with asthma symptoms, she was asked to discuss this with her primary care provider.  Chronic rhinitis  Currently well controlled  Continue loratadine, azelastine, fluticasone

## 2015-01-04 NOTE — Progress Notes (Signed)
01/04/2015  Grace Simpson [birth year] [id number]  Referring provider: Deatra James, MD [phone] [address] [place], [place] [address]  Chief Complaint: Shortness of Breath   Grace Simpson is a 29 y.o. female who is being seen today for follow-up.   HPI Comments: Asthma: At patient's last visit she was switched from Qvar to Symbicort due to poor symptom control. Since starting the Symbicort she has had daily panic attacks. She does have a diagnosis of anxiety but was having stable symptoms prior to the switch. She is using albuterol twice a day without relief of her symptoms. In addition she reports frequent nighttime awakenings and shortness of breath associated with her activities of daily living.  Chronic rhinitis: Symptoms have been stable.    ROS: Per HPI unless specifically indicated below Review of Systems   Drug Allergies:  Allergies  Allergen Reactions  . Corticosteroids Other (See Comments)    Adverse effect, jitttery  . Food     Melon-swelling (lips/tongue) itching  . Diphenhydramine Hcl Other (See Comments)    Adverse effect. jittery  . Sulfa Antibiotics Hives    Medications:  Current outpatient prescriptions:  .  Azelastine HCl 0.15 % SOLN, INSTILL 2 SPRAYS IEN BID FOR STUFFY NOSE OR DRAINAGE, Disp: , Rfl: 5 .  levothyroxine (SYNTHROID, LEVOTHROID) 137 MCG tablet, Take 137 mcg by mouth daily before breakfast., Disp: , Rfl:  .  loratadine (CLARITIN) 10 MG tablet, Take 10 mg by mouth daily., Disp: , Rfl:  .  nystatin (MYCOSTATIN) 100000 UNIT/ML suspension, Take 5 mLs by mouth as needed., Disp: , Rfl:  .  omeprazole (PRILOSEC) 40 MG capsule, Take 1 capsule (40 mg total) by mouth daily., Disp: 30 capsule, Rfl: 3 .  rivaroxaban (XARELTO) 20 MG TABS tablet, Take 1 tablet (20 mg total) by mouth daily with supper., Disp: 30 tablet, Rfl: 5 .  fluticasone (FLONASE) 50 MCG/ACT nasal spray, Place 2 sprays into both nostrils every morning., Disp: , Rfl:   Current  facility-administered medications:  .  predniSONE (DELTASONE) tablet 10 mg, 10 mg, Oral, Q breakfast, Cristal Ford, MD .  triamcinolone acetonide (KENALOG) 10 MG/ML injection 10 mg, 10 mg, Other, Once, Lenn Sink, DPM  Physical Exam: BP 154/84 mmHg  Pulse 85  Temp(Src) 98.1 F (36.7 C) (Oral)  Resp 20  Ht  (1.549 m)  Wt 289 lb (131.09 kg)  BMI 54.63 kg/m2  Physical Exam  Constitutional: She appears well-developed and well-nourished. No distress.  HENT:  Right Ear: External ear normal.  Left Ear: External ear normal.  Nose: Nose normal.  Mouth/Throat: Oropharynx is clear and moist.  Eyes: Conjunctivae are normal. Right eye exhibits no discharge. Left eye exhibits no discharge.  Cardiovascular: Normal rate, regular rhythm and normal heart sounds.   No murmur heard. Pulmonary/Chest: Effort normal and breath sounds normal. No respiratory distress. She has no wheezes. She has no rales.  Abdominal: Soft. Bowel sounds are normal.  Obese  Musculoskeletal: She exhibits no edema.  Lymphadenopathy:    She has no cervical adenopathy.  Neurological: She is alert.  Skin: No rash noted.  Vitals reviewed.   Diagnostics:   Spirometry: FEV1 85 %, FEV1/FVC  83%   Spirometry is in the normal range.   Assessment and Plan:  Moderate persistent asthma  Because of increased anxiety associated with Symbicort, I will switch her to Qvar 80 mg 2 puffs twice a day with spacer  Start Xopenex HFA as needed  Because panic attacks and  anxiety symptoms can coincide with asthma symptoms, she was asked to discuss this with her primary care provider.  Chronic rhinitis  Currently well controlled  Continue loratadine, azelastine, fluticasone    Return in about 4 weeks (around 02/01/2015).  Thank you for the opportunity to care for this patient.  Please do not hesitate to contact me with questions.  [facility]  [address] [place],  [place] [id number] [phone]

## 2015-01-07 NOTE — Telephone Encounter (Signed)
Rx:  Dulera 200/5 mcg, 2 inhalations via spacer device twice a day. Please send in script and offer patient a sample. Thank you.

## 2015-01-07 NOTE — Telephone Encounter (Signed)
I review dr Lane Hackerbhattis notes from last visit and looks like dr Clydie Braunbhatti has discontinued the symbicort and has started her on qvar 80. Informed pt to just continue using qvar and to stop symbicort.

## 2015-02-06 ENCOUNTER — Ambulatory Visit: Payer: Medicaid Other | Admitting: Internal Medicine

## 2015-03-12 ENCOUNTER — Ambulatory Visit: Payer: Medicaid Other | Admitting: *Deleted

## 2015-03-12 DIAGNOSIS — M779 Enthesopathy, unspecified: Secondary | ICD-10-CM

## 2015-03-12 NOTE — Patient Instructions (Signed)

## 2015-03-12 NOTE — Progress Notes (Signed)
Patient ID: Grace Simpson, female   DOB: [birth year], 30 y.o.   MRN: [id number] Patient presents for orthotic pick up.  Verbal and written break in and wear instructions given.  Patient will follow up in 4 weeks if symptoms worsen or fail to improve.

## 2015-03-18 DIAGNOSIS — M779 Enthesopathy, unspecified: Secondary | ICD-10-CM

## 2015-03-29 ENCOUNTER — Other Ambulatory Visit: Payer: Self-pay | Admitting: Physician Assistant

## 2015-03-29 DIAGNOSIS — N649 Disorder of breast, unspecified: Secondary | ICD-10-CM

## 2015-03-29 DIAGNOSIS — N644 Mastodynia: Secondary | ICD-10-CM

## 2015-03-29 DIAGNOSIS — L988 Other specified disorders of the skin and subcutaneous tissue: Secondary | ICD-10-CM

## 2015-04-04 ENCOUNTER — Ambulatory Visit
Admission: RE | Admit: 2015-04-04 | Discharge: 2015-04-04 | Disposition: A | Discharge status: Home / Self Care | Payer: Medicaid Other | Source: Ambulatory Visit | Attending: Physician Assistant | Admitting: Physician Assistant

## 2015-04-04 DIAGNOSIS — L988 Other specified disorders of the skin and subcutaneous tissue: Secondary | ICD-10-CM

## 2015-04-04 DIAGNOSIS — N649 Disorder of breast, unspecified: Secondary | ICD-10-CM

## 2015-04-04 DIAGNOSIS — N644 Mastodynia: Secondary | ICD-10-CM

## 2015-05-12 ENCOUNTER — Emergency Department (HOSPITAL_COMMUNITY)
Admission: EM | Admit: 2015-05-12 | Discharge: 2015-05-13 | Disposition: A | Discharge status: Home / Self Care | Payer: Medicaid Other | Attending: Emergency Medicine | Admitting: Emergency Medicine

## 2015-05-12 ENCOUNTER — Encounter (HOSPITAL_COMMUNITY): Payer: Self-pay | Admitting: Emergency Medicine

## 2015-05-12 DIAGNOSIS — K219 Gastro-esophageal reflux disease without esophagitis: Secondary | ICD-10-CM | POA: Insufficient documentation

## 2015-05-12 DIAGNOSIS — Z8619 Personal history of other infectious and parasitic diseases: Secondary | ICD-10-CM | POA: Diagnosis not present

## 2015-05-12 DIAGNOSIS — H43399 Other vitreous opacities, unspecified eye: Secondary | ICD-10-CM | POA: Insufficient documentation

## 2015-05-12 DIAGNOSIS — H538 Other visual disturbances: Secondary | ICD-10-CM | POA: Insufficient documentation

## 2015-05-12 DIAGNOSIS — Z87440 Personal history of urinary (tract) infections: Secondary | ICD-10-CM | POA: Insufficient documentation

## 2015-05-12 DIAGNOSIS — Z7951 Long term (current) use of inhaled steroids: Secondary | ICD-10-CM | POA: Diagnosis not present

## 2015-05-12 DIAGNOSIS — Z87891 Personal history of nicotine dependence: Secondary | ICD-10-CM | POA: Insufficient documentation

## 2015-05-12 DIAGNOSIS — E039 Hypothyroidism, unspecified: Secondary | ICD-10-CM | POA: Diagnosis not present

## 2015-05-12 DIAGNOSIS — F419 Anxiety disorder, unspecified: Secondary | ICD-10-CM | POA: Diagnosis not present

## 2015-05-12 DIAGNOSIS — Z87448 Personal history of other diseases of urinary system: Secondary | ICD-10-CM | POA: Diagnosis not present

## 2015-05-12 DIAGNOSIS — Z79899 Other long term (current) drug therapy: Secondary | ICD-10-CM | POA: Insufficient documentation

## 2015-05-12 DIAGNOSIS — H43393 Other vitreous opacities, bilateral: Secondary | ICD-10-CM

## 2015-05-12 DIAGNOSIS — I10 Essential (primary) hypertension: Secondary | ICD-10-CM | POA: Diagnosis not present

## 2015-05-12 DIAGNOSIS — Z8742 Personal history of other diseases of the female genital tract: Secondary | ICD-10-CM | POA: Insufficient documentation

## 2015-05-12 DIAGNOSIS — H539 Unspecified visual disturbance: Secondary | ICD-10-CM

## 2015-05-12 NOTE — ED Notes (Signed)
Pt states that she has had floaters in her eyes but her vision has gotten worse over the past several months. States that last night she couldn't see at all when she woke up until her eyes adjusted but she still feels like her vision is not 100%. 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 Alert and oriented.

## 2015-05-13 ENCOUNTER — Emergency Department (HOSPITAL_COMMUNITY): Payer: Medicaid Other

## 2015-05-13 LAB — CBC WITH DIFFERENTIAL/PLATELET
BASOS PCT: 0 %
Basophils Absolute: 0 10*3/uL (ref 0.0–0.1)
EOS ABS: 0.1 10*3/uL (ref 0.0–0.7)
Eosinophils Relative: 1 %
HEMATOCRIT: 37.4 % (ref 36.0–46.0)
HEMOGLOBIN: 12.6 g/dL (ref 12.0–15.0)
Lymphocytes Relative: 36 %
Lymphs Abs: 3.5 10*3/uL (ref 0.7–4.0)
MCH: 28.9 pg (ref 26.0–34.0)
MCHC: 33.7 g/dL (ref 30.0–36.0)
MCV: 85.8 fL (ref 78.0–100.0)
Monocytes Absolute: 0.5 10*3/uL (ref 0.1–1.0)
Monocytes Relative: 5 %
NEUTROS ABS: 5.6 10*3/uL (ref 1.7–7.7)
NEUTROS PCT: 58 %
Platelets: 344 10*3/uL (ref 150–400)
RBC: 4.36 MIL/uL (ref 3.87–5.11)
RDW: 14.3 % (ref 11.5–15.5)
WBC: 9.8 10*3/uL (ref 4.0–10.5)

## 2015-05-13 LAB — BASIC METABOLIC PANEL
ANION GAP: 9 (ref 5–15)
BUN: 6 mg/dL (ref 6–20)
CALCIUM: 9.2 mg/dL (ref 8.9–10.3)
CO2: 26 mmol/L (ref 22–32)
Chloride: 106 mmol/L (ref 101–111)
Creatinine, Ser: 0.77 mg/dL (ref 0.44–1.00)
GFR calc Af Amer: >60 mL/min (ref >60)
GFR calc non Af Amer: >60 mL/min (ref >60)
GLUCOSE: 91 mg/dL (ref 65–99)
POTASSIUM: 3.8 mmol/L (ref 3.5–5.1)
Sodium: 141 mmol/L (ref 135–145)

## 2015-05-13 NOTE — ED Provider Notes (Signed)
CSN: [id number]     Arrival date & time 05/12/15  2118 History  By signing my name below, I, Linus GalasMaharshi Patel, attest that this documentation has been prepared under the direction and in the presence of Gilda Creasehristopher J Clarece Drzewiecki, MD. Electronically Signed: Linus GalasMaharshi Patel, ED Scribe. 05/13/2015. 1:55 AM.    Chief Complaint  Patient presents with  . Blurred Vision   The history is provided by the patient. No language interpreter was used.   HPI Comments: Grace Simpson is a 30 y.o. female who presents to the Emergency Department with a PMHx of pseudo tumor cerebri and PE complaining of intermittent vision changes over the past several months worsening last night. Over that past several months, she has been having episodes where she sees "flashes". However, for the past 2 days she has had an increase in episodic flashes with the addition of "spots of black". She also reports she woke up last night and could not see anything at first but regained her vision shortly after. She called the on-call opthalmologist who referred the pt to the ED. Pt denies any other symptoms a this time. Pt is not on blood thinners.   Pt had a lumbar pucture that did not indicate the need for Topamax. Pt is followed [facility] Op[facility]mology at [facility].   Past Medical History  Diagnosis Date  . Hypothyroidism   . Obesity, morbid (more than 100 lbs over ideal weight or BMI > 40) (HCC)   . Bipolar affective (HCC)   . OCD (obsessive compulsive disorder)   . Oppositional defiant disorder   . Herpes     last outbreak 4 months ago  . Hyperprolactinemia (HCC)   . GERD (gastroesophageal reflux disease)   . Left breast abscess     not MRSA  . UTI (lower urinary tract infection)   . Trichomonas   . BV (bacterial vaginosis)   . Hemorrhoids   . Hypertension   . Pregnancy induced hypertension     unsure if PIH or CHTN - no meds  . Anxiety     no meds currently  . Obstructive sleep apnea     does not use CPAP  . Seasonal allergies   .  Pyelonephritis     history - no current problem  . S/P cesarean section 10/23/2012  . Headache(784.0)   . Migraines   . Anxiety    Past Surgical History  Procedure Laterality Date  . Tonsillectomy  1997  . Left breast abcess Left 2010  . Cholecystectomy  2009  . Adenoidectomy    . Cesarean section  11/20/2010    Procedure: CESAREAN SECTION;  Surgeon: Oliver PilaKathy W Richardson;  Location: [facility];  Service: Gynecology;  Laterality: N/A;  . Wisdom tooth extraction    . Cesarean section N/A 10/21/2012    Procedure: REPEAT CESAREAN SECTION;  Surgeon: Oliver PilaKathy W Richardson, MD;  Location: [facility] ORS;  Service: Obstetrics;  Laterality: N/A;   Family History  Problem Relation Age of Onset  . Adopted: Yes  . Family history unknown: Yes   Social History  Substance Use Topics  . Smoking status: Former Smoker -- 0.50 packs/day    Types: Cigarettes    Quit date: 03/01/2012  . Smokeless tobacco: Never Used  . Alcohol Use: No   OB History    Gravida Para Term Preterm AB TAB SAB Ectopic Multiple Living   2 2 2  0 0 0 0 0 0 2     Review of Systems  Constitutional: Negative for fever  and chills.  Eyes: Positive for visual disturbance.  Psychiatric/Behavioral: Negative for confusion.  All other systems reviewed and are negative.   Allergies  Corticosteroids; Food; Diphenhydramine hcl; and Sulfa antibiotics  Home Medications   Prior to Admission medications   Medication Sig Start Date End Date Taking? Authorizing Provider  azelastine (ASTELIN) 0.1 % nasal spray Place 2 sprays into both nostrils 2 (two) times daily. 02/25/15  Yes Historical Provider, MD  beclomethasone (QVAR) 80 MCG/ACT inhaler Inhale 2 puffs into the lungs 2 (two) times daily. 01/04/15  Yes Mikki Santee, MD  hydrOXYzine (VISTARIL) 25 MG capsule Take 25 mg by mouth 3 (three) times daily as needed. For anxiety. 03/11/15  Yes Historical Provider, MD  levalbuterol Pauline Aus HFA) 45 MCG/ACT inhaler Inhale 2 puffs into the lungs every 4 (four)  hours as needed for wheezing. 01/04/15  Yes Mikki Santee, MD  levothyroxine (SYNTHROID, LEVOTHROID) 137 MCG tablet Take 137 mcg by mouth daily before breakfast.   Yes Historical Provider, MD  loratadine (CLARITIN) 10 MG tablet Take 10 mg by mouth daily.   Yes Historical Provider, MD  omeprazole (PRILOSEC) 40 MG capsule Take 1 capsule (40 mg total) by mouth daily. 12/03/14  Yes Cristal Ford, MD  levalbuterol Pauline Aus) 1.25 MG/3ML nebulizer solution Take 1.25 mg by nebulization every 4 (four) hours as needed for wheezing. Patient not taking: Reported on 05/13/2015 01/04/15   Mikki Santee, MD  rivaroxaban (XARELTO) 20 MG TABS tablet Take 1 tablet (20 mg total) by mouth daily with supper. Patient not taking: Reported on 05/13/2015 07/05/14   Richarda Overlie, MD   BP 137/92 mmHg  Pulse 82  Temp(Src) 98 F (36.7 C) (Oral)  Resp 18  SpO2 97%  LMP 04/26/2015 (Exact Date)   Physical Exam  Constitutional: She is oriented to person, place, and time. She appears well-developed and well-nourished. No distress.  HENT:  Head: Normocephalic and atraumatic.  Right Ear: Hearing normal.  Left Ear: Hearing normal.  Nose: Nose normal.  Mouth/Throat: Oropharynx is clear and moist and mucous membranes are normal.  Eyes: Conjunctivae and EOM are normal. Pupils are equal, round, and reactive to light.  On fundoscopic exam -  bilateral papilledema; no hemorrhages   Neck: Normal range of motion. Neck supple.  Cardiovascular: Regular rhythm, S1 normal and S2 normal.  Exam reveals no gallop and no friction rub.   No murmur heard. Pulmonary/Chest: Effort normal and breath sounds normal. No respiratory distress. She exhibits no tenderness.  Abdominal: Soft. Normal appearance and bowel sounds are normal. There is no hepatosplenomegaly. There is no tenderness. There is no rebound, no guarding, no tenderness at McBurney's point and negative Murphy's sign. No hernia.  Musculoskeletal: Normal range of motion.   Neurological: She is alert and oriented to person, place, and time. She has normal strength. No cranial nerve deficit or sensory deficit. Coordination normal. GCS eye subscore is 4. GCS verbal subscore is 5. GCS motor subscore is 6.  Skin: Skin is warm, dry and intact. No rash noted. No cyanosis.  Psychiatric: She has a normal mood and affect. Her speech is normal and behavior is normal. Thought content normal.  Nursing note and vitals reviewed.   ED Course  Procedures   DIAGNOSTIC STUDIES: Oxygen Saturation is 98% on room air, normal by my interpretation.    COORDINATION OF CARE: 1:43 AM Will order CT head and blood work. Discussed treatment plan with pt at bedside and pt agreed to plan.   Labs Review Labs Reviewed  CBC WITH DIFFERENTIAL/PLATELET  BASIC METABOLIC PANEL    Imaging Review Ct Head Wo Contrast  05/13/2015  CLINICAL DATA:  30 year old female with a the shoulder changes EXAM: CT HEAD WITHOUT CONTRAST TECHNIQUE: Contiguous axial images were obtained from the base of the skull through the vertex without intravenous contrast. COMPARISON:  MRI dated 01/03/2013 FINDINGS: The ventricles and the sulci are appropriate in size for the patient's age. There is no intracranial hemorrhage. No midline shift or mass effect identified. The gray-white matter differentiation is preserved. There is mild mucoperiosteal thickening of paranasal sinuses. The mastoid air cells clear. The calvarium is intact. IMPRESSION: No acute intracranial pathology. Electronically Signed   By: Elgie Collard M.D.   On: 05/13/2015 02:56   I have personally reviewed and evaluated these images and lab results as part of my medical decision-making.  MDM   Final diagnoses:  Visual disturbance  Floaters in visual field, bilateral    Patient presents to the emergency part for evaluation of vision changes. Patient reports that she has been experiencing episodes of flashing lights and floaters in her eyes for  last couple of months. She sees ophthalmology, Dr. Earline Mayotte at [facility]. She is monitored for elevated intracranial pressure and papilledema.  Symptoms do not seem consistent with progression of her intracranial pressure and papilledema. She has intermittent episodes of blurred vision that seemed to occur when she changes light intensity or focus is from distance to near. It lasts briefly and then improves. Her visual acuity is normal for her here in the ER. CT scan did not show any acute abnormality. Neurologic exam was otherwise unremarkable. Patient is safe for discharge and follow-up in the clinic with ophthalmology.  I personally performed the services described in this documentation, which was scribed in my presence. The recorded information has been reviewed and is accurate.     Gilda Crease, MD 05/13/15 [phone]

## 2015-05-13 NOTE — Discharge Instructions (Signed)
Visual Disturbances °You have had a disturbance in your vision. This may be caused by various conditions, such as: °· Migraines. Migraine headaches are often preceded by a disturbance in vision. Blind spots or light flashes are followed by a headache. This type of visual disturbance is temporary. It does not damage the eye. °· Glaucoma. This is caused by increased pressure in the eye. Symptoms include haziness, blurred vision, or seeing rainbow colored circles when looking at bright lights. Partial or complete visual loss can occur. You may or may not experience eye pain. Visual loss may be gradual or sudden and is irreversible. Glaucoma is the leading cause of blindness. °· Retina problems. Vision will be reduced if the retina becomes detached or if there is a circulation problem as with diabetes, high blood pressure, or a mini-stroke. Symptoms include seeing "floaters," flashes of light, or shadows, as if a curtain has fallen over your eye. °· Optic nerve problems. The main nerve in your eye can be damaged by redness, soreness, and swelling (inflammation), poor circulation, drugs, and toxins. °It is very important to have a complete exam done by a specialist to determine the exact cause of your eye problem. The specialist may recommend medicines or surgery, depending on the cause of the problem. This can help prevent further loss of vision or reduce the risk of having a stroke. Contact the caregiver to whom you have been referred and arrange for follow-up care right away. °SEEK IMMEDIATE MEDICAL CARE IF:  °· Your vision gets worse. °· You develop severe headaches. °· You have any weakness or numbness in the face, arms, or legs. °· You have any trouble speaking or walking. °  °This information is not intended to replace advice given to you by your health care provider. Make sure you discuss any questions you have with your health care provider. °  °Document Released: 03/12/2004 Document Revised: 04/27/2011 Document  Reviewed: 07/12/2013 °Elsevier Interactive Patient Education ©2016 [employer] ° °

## 2015-05-28 ENCOUNTER — Other Ambulatory Visit: Payer: Self-pay | Admitting: Allergy and Immunology

## 2015-10-27 ENCOUNTER — Other Ambulatory Visit: Payer: Self-pay | Admitting: Allergy and Immunology

## 2016-07-07 LAB — OB RESULTS CONSOLE HEPATITIS B SURFACE ANTIGEN: HEP B S AG: NEGATIVE

## 2016-07-07 LAB — OB RESULTS CONSOLE ANTIBODY SCREEN: Antibody Screen: NEGATIVE

## 2016-07-07 LAB — OB RESULTS CONSOLE RUBELLA ANTIBODY, IGM: RUBELLA: IMMUNE

## 2016-07-07 LAB — OB RESULTS CONSOLE ABO/RH: RH Type: NEGATIVE

## 2016-07-07 LAB — OB RESULTS CONSOLE GC/CHLAMYDIA
CHLAMYDIA, DNA PROBE: NEGATIVE
GC PROBE AMP, GENITAL: NEGATIVE

## 2016-07-07 LAB — OB RESULTS CONSOLE RPR: RPR: NONREACTIVE

## 2016-07-07 LAB — OB RESULTS CONSOLE HIV ANTIBODY (ROUTINE TESTING): HIV: NONREACTIVE

## 2017-01-04 LAB — OB RESULTS CONSOLE GBS: GBS: NEGATIVE

## 2017-01-19 ENCOUNTER — Telehealth (HOSPITAL_COMMUNITY): Payer: Self-pay | Admitting: *Deleted

## 2017-01-19 ENCOUNTER — Encounter (HOSPITAL_COMMUNITY): Payer: Self-pay | Admitting: *Deleted

## 2017-01-19 NOTE — Telephone Encounter (Signed)
Preadmission screen  

## 2017-01-21 ENCOUNTER — Encounter (HOSPITAL_COMMUNITY): Payer: Self-pay

## 2017-02-01 ENCOUNTER — Encounter (HOSPITAL_COMMUNITY)
Admission: RE | Admit: 2017-02-01 | Discharge: 2017-02-01 | Disposition: A | Discharge status: Home / Self Care | Payer: Medicaid Other | Source: Ambulatory Visit | Attending: Obstetrics and Gynecology | Admitting: Obstetrics and Gynecology

## 2017-02-01 HISTORY — DX: Personal history of other diseases of urinary system: Z87.448

## 2017-02-01 HISTORY — DX: Depression, unspecified: F32.A

## 2017-02-01 HISTORY — DX: Major depressive disorder, single episode, unspecified: F32.9

## 2017-02-01 HISTORY — DX: Anemia, unspecified: D64.9

## 2017-02-01 HISTORY — DX: Benign intracranial hypertension: G93.2

## 2017-02-01 HISTORY — DX: Personal history of pulmonary embolism: Z86.711

## 2017-02-01 LAB — CBC
HCT: 36.2 % (ref 36.0–46.0)
Hemoglobin: 12.3 g/dL (ref 12.0–15.0)
MCH: 31 pg (ref 26.0–34.0)
MCHC: 34 g/dL (ref 30.0–36.0)
MCV: 91.2 fL (ref 78.0–100.0)
Platelets: 195 10*3/uL (ref 150–400)
RBC: 3.97 MIL/uL (ref 3.87–5.11)
RDW: 13.8 % (ref 11.5–15.5)
WBC: 7.5 10*3/uL (ref 4.0–10.5)

## 2017-02-01 LAB — PROTIME-INR
INR: 0.9
PROTHROMBIN TIME: 12 s (ref 11.4–15.2)

## 2017-02-01 LAB — TYPE AND SCREEN
ABO/RH(D): B NEG
Antibody Screen: NEGATIVE

## 2017-02-01 LAB — APTT: APTT: 30 s (ref 24–36)

## 2017-02-01 NOTE — Anesthesia Preprocedure Evaluation (Signed)
Anesthesia Evaluation  Patient identified by MRN, date of birth, ID band Patient awake    Reviewed: Allergy & Precautions, H&P , NPO status , Patient's Chart, lab work & pertinent test results  Airway Mallampati: II  TM Distance: >3 FB Neck ROM: Full    Dental no notable dental hx. (+) Poor Dentition, Chipped   Pulmonary neg pulmonary ROS, shortness of breath and with exertion, sleep apnea , former smoker,    Pulmonary exam normal breath sounds clear to auscultation       Cardiovascular hypertension, Normal cardiovascular exam Rhythm:Regular Rate:Normal     Neuro/Psych PSYCHIATRIC DISORDERS Anxiety Bipolar Disorder OCD   GI/Hepatic GERD  Medicated and Controlled,  Endo/Other  Hypothyroidism Morbid obesityHx/o Hyperprolactinemia  Renal/GU Renal diseaseHx/o UTI's  negative genitourinary   Musculoskeletal negative musculoskeletal ROS (+)   Abdominal (+) + obese,   Peds  Hematology negative hematology ROS (+)   Anesthesia Other Findings   Reproductive/Obstetrics (+) Pregnancy Previous C/Section for Breech                             Anesthesia Physical  Anesthesia Plan  ASA: III  Anesthesia Plan: Spinal   Post-op Pain Management:    Induction:   PONV Risk Score and Plan: 2 and Treatment may vary due to age or medical condition  Airway Management Planned: Natural Airway and Nasal Cannula  Additional Equipment:   Intra-op Plan:   Post-operative Plan:   Informed Consent: I have reviewed the patients History and Physical, chart, labs and discussed the procedure including the risks, benefits and alternatives for the proposed anesthesia with the patient or authorized representative who has indicated his/her understanding and acceptance.   Dental advisory given  Plan Discussed with: CRNA, Anesthesiologist and Surgeon  Anesthesia Plan Comments:         Anesthesia Quick  Evaluation

## 2017-02-01 NOTE — Patient Instructions (Signed)
Grace Simpson  02/01/2017   Your procedure is scheduled on:  02/02/2017  Enter through the Main Entrance of [facility] at 0530 AM.  Pick up the phone at the desk and dial [phone]  Call this number if you have problems the morning of [phone]  Remember:   Do not eat food:After Midnight.  Do not drink clear liquids: After Midnight.  Take these medicines the morning of surgery with A SIP OF WATER: take your synthroid.  May take your vistaril, prilosec and claritin if you normally take them in the morning.   Do not wear jewelry, make-up or nail polish.  Do not wear lotions, powders, or perfumes. Do not wear deodorant.  Do not shave 48 hours prior to surgery.  Do not bring valuables to the hospital.  [facility] is not   responsible for any belongings or valuables brought to the hospital.  Contacts, dentures or bridgework may not be worn into surgery.  Leave suitcase in the car. After surgery it may be brought to your room.  For patients admitted to the hospital, checkout time is 11:00 AM the day of              discharge.    N/A   Please read over the following fact sheets that you were given:   Surgical Site Infection Prevention

## 2017-02-01 NOTE — H&P (Signed)
Grace Simpson is a 31 y.o. female G3P2002 at 6439 1/7 (EDD 02/08/17 by LMP c/w 9 week US) presenting for scheduled repeat c-section with h/o 2 prior c-sections.  She declines a concurrrent BTL.  Prenatal care complicated by:  1) h/o thromboembolism--on lovenox 120mg  SQ BID. D/c 36 hours prior to c-section.  2) morbid obesity--BMI 52 3) prior c-section x 2 4) h/o HSV, on suppression 5) hypothyroidism--levels stable  6) h/o depressive disorder-stable  OB History    Gravida Para Term Preterm AB Living   3 2 2  0 0 2   SAB TAB Ectopic Multiple Live Births   0 0 0 0 2    2012, 2014 LTCS x 2  Past Medical History:  Diagnosis Date  . Anemia   . Anxiety    no meds currently  . Anxiety   . Bipolar affective (HCC)   . BV (bacterial vaginosis)   . 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 GERD (gastroesophageal reflux disease)   . Headache(784.0)   . Hemorrhoids   . Herpes    last outbreak 4 months ago  . History of pyelonephritis   . Hx pulmonary embolism   . Hyperprolactinemia (HCC)   . Hypertension   . Hypothyroidism   . Left breast abscess    not MRSA  . Migraines   . Obesity, morbid (more than 100 lbs over ideal weight or BMI > 40) (HCC)   . Obstructive sleep apnea    does not use CPAP  . OCD (obsessive compulsive disorder)   . Oppositional defiant disorder   . Pregnancy induced hypertension    unsure if PIH or CHTN - no meds  . Pseudotumor cerebri    increased CSFi pressure on optic nerve, back to nl level  . Pyelonephritis    history - no current problem  . S/P cesarean section 10/23/2012  . Seasonal allergies   . Trichomonas   . UTI (lower urinary tract infection)    Past Surgical History:  Procedure Laterality Date  . ADENOIDECTOMY    . CESAREAN SECTION  11/20/2010   Procedure: CESAREAN SECTION;  Surgeon: Oliver PilaKathy W Daniel Ritthaler;  Location: [facility] OR[facility];  Service: Gynecology;  Laterality: N/A;  . CESAREAN SECTION N/A 10/21/2012   Procedure: REPEAT  CESAREAN SECTION;  Surgeon: Oliver PilaKathy W Manaia Samad, MD;  Location: [facility];  Service: Obstetrics;  Laterality: N/A;  . CHOLECYSTECTOMY  2009  . left breast abcess Left 2010  . TONSILLECTOMY  1997  . WISDOM TOOTH EXTRACTION     Family History: She was adopted. Family history is unknown by patient. Social History:  reports that she quit smoking about 8 months ago. Her smoking use included cigarettes. She smoked 0.50 packs per day. she has never used smokeless tobacco. She reports that she does not drink alcohol or use drugs.     Maternal Diabetes: No Genetic Screening: Normal Maternal Ultrasounds/Referrals: Normal Fetal Ultrasounds or other Referrals:  None Maternal Substance Abuse:  No Significant Maternal Medications:  Meds include: Syntroid Other:  Lovenox, valtrex  Significant Maternal Lab Results:  Lab values include: Group B Strep positive, Rh negative Other Comments:  None  Review of Systems  Cardiovascular: Negative for chest pain.  Gastrointestinal: Negative for constipation.  Genitourinary: Negative for dysuria.   Maternal Medical History:  Contractions: Frequency: irregular.   Perceived severity is mild.    Fetal activity: Perceived fetal activity is normal.    Prenatal complications: Thrombophilia.   Prenatal Complications - Diabetes:  none.      Last menstrual period 05/04/2016. Maternal Exam:  Uterine Assessment: Contraction strength is mild.  Contraction frequency is irregular.   Abdomen: Patient reports no abdominal tenderness. Introitus: Normal vulva. Normal vagina.    Physical Exam  Constitutional: She appears well-developed.  Cardiovascular: Normal rate and regular rhythm.  Respiratory: Effort normal.  GI: Soft.  Genitourinary: Vagina normal.  Neurological: She is alert.  Psychiatric: She has a normal mood and affect.    Prenatal labs: ABO, Rh: --/--/B NEG (12/17 1140) Antibody: NEG (12/17 1140) Rubella: Immune (05/22 0000) RPR: Nonreactive (05/22  0000)  HBsAg: Negative (05/22 0000)  HIV: Non-reactive (05/22 0000)  GBS: Negative (11/19 0000)  One hour GCT 103 First trimester screen neg Essential panel negative  Assessment/Plan: Risks and benefits of c-section d/w pt in detail including, bleeding infection and possible damage to bowel and bladder. She would accept a blood transfusion if needed.  She declines tubal sterilization with her c-section.   Pt desires to proceed.     Oliver PilaKathy W Rafaela Dinius 02/01/2017, 5:33 PM

## 2017-02-02 ENCOUNTER — Inpatient Hospital Stay (HOSPITAL_COMMUNITY)
Admission: RE | Admit: 2017-02-02 | Discharge: 2017-02-05 | DRG: 787 | Disposition: A | Discharge status: Home / Self Care | Payer: Medicaid Other | Source: Ambulatory Visit | Attending: Obstetrics and Gynecology | Admitting: Obstetrics and Gynecology

## 2017-02-02 ENCOUNTER — Inpatient Hospital Stay (HOSPITAL_COMMUNITY): Payer: Medicaid Other | Admitting: Anesthesiology

## 2017-02-02 ENCOUNTER — Encounter (HOSPITAL_COMMUNITY)
Admission: RE | Disposition: A | Discharge status: Home / Self Care | Payer: Self-pay | Source: Ambulatory Visit | Attending: Obstetrics and Gynecology

## 2017-02-02 ENCOUNTER — Encounter (HOSPITAL_COMMUNITY): Payer: Self-pay

## 2017-02-02 ENCOUNTER — Other Ambulatory Visit: Payer: Self-pay

## 2017-02-02 DIAGNOSIS — O26893 Other specified pregnancy related conditions, third trimester: Secondary | ICD-10-CM | POA: Diagnosis present

## 2017-02-02 DIAGNOSIS — E039 Hypothyroidism, unspecified: Secondary | ICD-10-CM | POA: Diagnosis present

## 2017-02-02 DIAGNOSIS — Z3493 Encounter for supervision of normal pregnancy, unspecified, third trimester: Secondary | ICD-10-CM

## 2017-02-02 DIAGNOSIS — O34211 Maternal care for low transverse scar from previous cesarean delivery: Principal | ICD-10-CM | POA: Diagnosis present

## 2017-02-02 DIAGNOSIS — Z6791 Unspecified blood type, Rh negative: Secondary | ICD-10-CM

## 2017-02-02 DIAGNOSIS — O99284 Endocrine, nutritional and metabolic diseases complicating childbirth: Secondary | ICD-10-CM | POA: Diagnosis present

## 2017-02-02 DIAGNOSIS — Z98891 History of uterine scar from previous surgery: Secondary | ICD-10-CM

## 2017-02-02 DIAGNOSIS — O99824 Streptococcus B carrier state complicating childbirth: Secondary | ICD-10-CM | POA: Diagnosis present

## 2017-02-02 DIAGNOSIS — O99214 Obesity complicating childbirth: Secondary | ICD-10-CM | POA: Diagnosis present

## 2017-02-02 DIAGNOSIS — K219 Gastro-esophageal reflux disease without esophagitis: Secondary | ICD-10-CM | POA: Diagnosis present

## 2017-02-02 DIAGNOSIS — Z3A39 39 weeks gestation of pregnancy: Secondary | ICD-10-CM | POA: Diagnosis not present

## 2017-02-02 DIAGNOSIS — O9832 Other infections with a predominantly sexual mode of transmission complicating childbirth: Secondary | ICD-10-CM | POA: Diagnosis present

## 2017-02-02 DIAGNOSIS — Z87891 Personal history of nicotine dependence: Secondary | ICD-10-CM | POA: Diagnosis not present

## 2017-02-02 DIAGNOSIS — O9962 Diseases of the digestive system complicating childbirth: Secondary | ICD-10-CM | POA: Diagnosis present

## 2017-02-02 DIAGNOSIS — Z86711 Personal history of pulmonary embolism: Secondary | ICD-10-CM

## 2017-02-02 DIAGNOSIS — A6000 Herpesviral infection of urogenital system, unspecified: Secondary | ICD-10-CM | POA: Diagnosis present

## 2017-02-02 LAB — RPR: RPR Ser Ql: NONREACTIVE

## 2017-02-02 SURGERY — Surgical Case
Anesthesia: Spinal

## 2017-02-02 MED ORDER — SIMETHICONE 80 MG PO CHEW
80.0000 mg | CHEWABLE_TABLET | Freq: Three times a day (TID) | ORAL | Status: DC
  Administered 2017-02-03 – 2017-02-05 (×8): 80 mg via ORAL
  Filled 2017-02-02 (×8): qty 1

## 2017-02-02 MED ORDER — SCOPOLAMINE 1 MG/3DAYS TD PT72
1.0000 | MEDICATED_PATCH | TRANSDERMAL | Status: DC
  Administered 2017-02-02: 1.5 mg via TRANSDERMAL
  Filled 2017-02-02 (×2): qty 1

## 2017-02-02 MED ORDER — MEPERIDINE HCL 25 MG/ML IJ SOLN: 6.2500 mg | INTRAMUSCULAR | Status: DC | PRN

## 2017-02-02 MED ORDER — PANTOPRAZOLE SODIUM 40 MG PO TBEC
40.0000 mg | DELAYED_RELEASE_TABLET | Freq: Every day | ORAL | Status: DC
  Administered 2017-02-03 – 2017-02-05 (×3): 40 mg via ORAL
  Filled 2017-02-02 (×3): qty 1

## 2017-02-02 MED ORDER — LEVOTHYROXINE SODIUM 137 MCG PO TABS
137.0000 ug | ORAL_TABLET | Freq: Every day | ORAL | Status: DC
  Administered 2017-02-03 – 2017-02-05 (×4): 137 ug via ORAL
  Filled 2017-02-02 (×5): qty 1

## 2017-02-02 MED ORDER — NALBUPHINE HCL 10 MG/ML IJ SOLN: 5.0000 mg | Freq: Once | INTRAMUSCULAR | Status: DC | PRN

## 2017-02-02 MED ORDER — ONDANSETRON HCL 4 MG/2ML IJ SOLN
INTRAMUSCULAR | Status: AC
  Filled 2017-02-02: qty 2

## 2017-02-02 MED ORDER — NALBUPHINE HCL 10 MG/ML IJ SOLN: 5.0000 mg | INTRAMUSCULAR | Status: DC | PRN

## 2017-02-02 MED ORDER — ONDANSETRON HCL 4 MG/2ML IJ SOLN: 4.0000 mg | Freq: Three times a day (TID) | INTRAMUSCULAR | Status: DC | PRN

## 2017-02-02 MED ORDER — PROMETHAZINE HCL 25 MG/ML IJ SOLN: 12.5000 mg | Freq: Four times a day (QID) | INTRAMUSCULAR | Status: DC | PRN

## 2017-02-02 MED ORDER — LACTATED RINGERS IV SOLN
INTRAVENOUS | Status: DC
  Administered 2017-02-02 (×2): via INTRAVENOUS

## 2017-02-02 MED ORDER — PRENATAL MULTIVITAMIN CH
1.0000 | ORAL_TABLET | Freq: Every day | ORAL | Status: DC
  Administered 2017-02-03 – 2017-02-05 (×3): 1 via ORAL
  Filled 2017-02-02 (×3): qty 1

## 2017-02-02 MED ORDER — ONDANSETRON HCL 4 MG/2ML IJ SOLN: 4.0000 mg | Freq: Once | INTRAMUSCULAR | Status: DC | PRN

## 2017-02-02 MED ORDER — DEXTROSE 5 % IV SOLN
3.0000 g | INTRAVENOUS | Status: AC
  Administered 2017-02-02: 3 g via INTRAVENOUS

## 2017-02-02 MED ORDER — IBUPROFEN 600 MG PO TABS
600.0000 mg | ORAL_TABLET | Freq: Four times a day (QID) | ORAL | Status: DC
  Administered 2017-02-02 – 2017-02-05 (×11): 600 mg via ORAL
  Filled 2017-02-02 (×11): qty 1

## 2017-02-02 MED ORDER — NALOXONE HCL 0.4 MG/ML IJ SOLN: 0.4000 mg | INTRAMUSCULAR | Status: DC | PRN

## 2017-02-02 MED ORDER — ZOLPIDEM TARTRATE 5 MG PO TABS: 5.0000 mg | ORAL_TABLET | Freq: Every evening | ORAL | Status: DC | PRN

## 2017-02-02 MED ORDER — PHENYLEPHRINE 8 MG IN D5W 100 ML (0.08MG/ML) PREMIX OPTIME
INJECTION | INTRAVENOUS | Status: DC | PRN
  Administered 2017-02-02: 60 ug/min via INTRAVENOUS

## 2017-02-02 MED ORDER — DIPHENHYDRAMINE HCL 25 MG PO CAPS: 25.0000 mg | ORAL_CAPSULE | ORAL | Status: DC | PRN

## 2017-02-02 MED ORDER — DIPHENHYDRAMINE HCL 50 MG/ML IJ SOLN: 12.5000 mg | INTRAMUSCULAR | Status: DC | PRN

## 2017-02-02 MED ORDER — LORATADINE 10 MG PO TABS: 10.0000 mg | ORAL_TABLET | Freq: Every day | ORAL | Status: DC | PRN

## 2017-02-02 MED ORDER — KETOROLAC TROMETHAMINE 30 MG/ML IJ SOLN: 30.0000 mg | Freq: Four times a day (QID) | INTRAMUSCULAR | Status: AC | PRN

## 2017-02-02 MED ORDER — PHENYLEPHRINE HCL 10 MG/ML IJ SOLN
INTRAMUSCULAR | Status: DC | PRN
  Administered 2017-02-02: 40 ug via INTRAVENOUS
  Administered 2017-02-02: 80 ug via INTRAVENOUS
  Administered 2017-02-02: 40 ug via INTRAVENOUS

## 2017-02-02 MED ORDER — SODIUM CHLORIDE 0.9% FLUSH: 3.0000 mL | INTRAVENOUS | Status: DC | PRN

## 2017-02-02 MED ORDER — COCONUT OIL OIL
1.0000 "application " | TOPICAL_OIL | Status: DC | PRN
  Filled 2017-02-02: qty 120

## 2017-02-02 MED ORDER — WITCH HAZEL-GLYCERIN EX PADS: 1.0000 "application " | MEDICATED_PAD | CUTANEOUS | Status: DC | PRN

## 2017-02-02 MED ORDER — SIMETHICONE 80 MG PO CHEW
80.0000 mg | CHEWABLE_TABLET | ORAL | Status: DC
  Administered 2017-02-02 – 2017-02-04 (×3): 80 mg via ORAL
  Filled 2017-02-02 (×3): qty 1

## 2017-02-02 MED ORDER — OXYMETAZOLINE HCL 0.05 % NA SOLN
1.0000 | Freq: Every day | NASAL | Status: DC | PRN
Start: 2017-02-02 — End: 2017-02-05
  Filled 2017-02-02: qty 15

## 2017-02-02 MED ORDER — DIBUCAINE 1 % RE OINT: 1.0000 "application " | TOPICAL_OINTMENT | RECTAL | Status: DC | PRN

## 2017-02-02 MED ORDER — BUPIVACAINE IN DEXTROSE 0.75-8.25 % IT SOLN
INTRATHECAL | Status: DC | PRN
  Administered 2017-02-02: 1.6 mL via INTRATHECAL

## 2017-02-02 MED ORDER — HYDROXYZINE PAMOATE 25 MG PO CAPS
25.0000 mg | ORAL_CAPSULE | Freq: Three times a day (TID) | ORAL | Status: DC | PRN
  Filled 2017-02-02: qty 1

## 2017-02-02 MED ORDER — ACETAMINOPHEN 325 MG PO TABS
650.0000 mg | ORAL_TABLET | ORAL | Status: DC | PRN
  Administered 2017-02-04 – 2017-02-05 (×5): 650 mg via ORAL
  Filled 2017-02-02 (×5): qty 2

## 2017-02-02 MED ORDER — ONDANSETRON HCL 4 MG/2ML IJ SOLN
4.0000 mg | Freq: Once | INTRAMUSCULAR | Status: AC
  Administered 2017-02-02: 4 mg via INTRAVENOUS
  Filled 2017-02-02: qty 2

## 2017-02-02 MED ORDER — MORPHINE SULFATE (PF) 0.5 MG/ML IJ SOLN
INTRAMUSCULAR | Status: AC
  Filled 2017-02-02: qty 10

## 2017-02-02 MED ORDER — OXYCODONE HCL 5 MG PO TABS
10.0000 mg | ORAL_TABLET | ORAL | Status: DC | PRN
  Administered 2017-02-04 – 2017-02-05 (×4): 10 mg via ORAL
  Filled 2017-02-02 (×4): qty 2

## 2017-02-02 MED ORDER — ONDANSETRON HCL 4 MG/2ML IJ SOLN
4.0000 mg | INTRAMUSCULAR | Status: DC | PRN
  Administered 2017-02-02: 4 mg via INTRAVENOUS
  Filled 2017-02-02: qty 2

## 2017-02-02 MED ORDER — FENTANYL CITRATE (PF) 100 MCG/2ML IJ SOLN: 25.0000 ug | INTRAMUSCULAR | Status: DC | PRN

## 2017-02-02 MED ORDER — OXYTOCIN 10 UNIT/ML IJ SOLN
INTRAVENOUS | Status: DC | PRN
  Administered 2017-02-02: 40 [IU] via INTRAVENOUS

## 2017-02-02 MED ORDER — SENNOSIDES-DOCUSATE SODIUM 8.6-50 MG PO TABS
2.0000 | ORAL_TABLET | ORAL | Status: DC
  Administered 2017-02-02 – 2017-02-04 (×3): 2 via ORAL
  Filled 2017-02-02 (×3): qty 2

## 2017-02-02 MED ORDER — OXYCODONE HCL 5 MG PO TABS
5.0000 mg | ORAL_TABLET | ORAL | Status: DC | PRN
Start: 2017-02-02 — End: 2017-02-05
  Administered 2017-02-03 – 2017-02-04 (×5): 5 mg via ORAL
  Filled 2017-02-02 (×5): qty 1

## 2017-02-02 MED ORDER — OXYTOCIN 10 UNIT/ML IJ SOLN
INTRAMUSCULAR | Status: AC
  Filled 2017-02-02: qty 4

## 2017-02-02 MED ORDER — ENOXAPARIN SODIUM 120 MG/0.8ML ~~LOC~~ SOLN
120.0000 mg | Freq: Two times a day (BID) | SUBCUTANEOUS | Status: DC
  Administered 2017-02-03 – 2017-02-05 (×5): 120 mg via SUBCUTANEOUS
  Filled 2017-02-02 (×7): qty 0.8

## 2017-02-02 MED ORDER — NALOXONE HCL 0.4 MG/ML IJ SOLN
1.0000 ug/kg/h | INTRAVENOUS | Status: DC | PRN
  Filled 2017-02-02: qty 5

## 2017-02-02 MED ORDER — LACTATED RINGERS IV SOLN
INTRAVENOUS | Status: DC | PRN
  Administered 2017-02-02: 08:00:00 via INTRAVENOUS

## 2017-02-02 MED ORDER — FENTANYL CITRATE (PF) 100 MCG/2ML IJ SOLN
INTRAMUSCULAR | Status: DC | PRN
  Administered 2017-02-02: 25 ug via INTRAVENOUS

## 2017-02-02 MED ORDER — ONDANSETRON HCL 4 MG/2ML IJ SOLN
INTRAMUSCULAR | Status: DC | PRN
  Administered 2017-02-02: 4 mg via INTRAVENOUS

## 2017-02-02 MED ORDER — PHENYLEPHRINE 8 MG IN D5W 100 ML (0.08MG/ML) PREMIX OPTIME
INJECTION | INTRAVENOUS | Status: AC
  Filled 2017-02-02: qty 100

## 2017-02-02 MED ORDER — SIMETHICONE 80 MG PO CHEW: 80.0000 mg | CHEWABLE_TABLET | ORAL | Status: DC | PRN

## 2017-02-02 MED ORDER — SCOPOLAMINE 1 MG/3DAYS TD PT72
1.0000 | MEDICATED_PATCH | Freq: Once | TRANSDERMAL | Status: DC
  Filled 2017-02-02: qty 1

## 2017-02-02 MED ORDER — MEPERIDINE HCL 25 MG/ML IJ SOLN
6.2500 mg | INTRAMUSCULAR | Status: DC | PRN
Start: 2017-02-02 — End: 2017-02-05

## 2017-02-02 MED ORDER — KETOROLAC TROMETHAMINE 30 MG/ML IJ SOLN
30.0000 mg | Freq: Four times a day (QID) | INTRAMUSCULAR | Status: AC | PRN
Start: 2017-02-02 — End: 2017-02-03
  Administered 2017-02-02: 30 mg via INTRAVENOUS
  Filled 2017-02-02: qty 1

## 2017-02-02 MED ORDER — LACTATED RINGERS IV SOLN
INTRAVENOUS | Status: DC
  Administered 2017-02-02 (×2): via INTRAVENOUS

## 2017-02-02 MED ORDER — FENTANYL CITRATE (PF) 100 MCG/2ML IJ SOLN
INTRAMUSCULAR | Status: AC
  Filled 2017-02-02: qty 2

## 2017-02-02 MED ORDER — MORPHINE SULFATE (PF) 10 MG/ML IV SOLN
INTRAVENOUS | Status: DC | PRN
  Administered 2017-02-02: .2 mg via BUCCAL

## 2017-02-02 MED ORDER — OXYTOCIN 40 UNITS IN LACTATED RINGERS INFUSION - SIMPLE MED
2.5000 [IU]/h | INTRAVENOUS | Status: AC
  Administered 2017-02-02: 2.5 [IU]/h via INTRAVENOUS

## 2017-02-02 MED ORDER — TETANUS-DIPHTH-ACELL PERTUSSIS 5-2.5-18.5 LF-MCG/0.5 IM SUSP: 0.5000 mL | Freq: Once | INTRAMUSCULAR | Status: DC

## 2017-02-02 SURGICAL SUPPLY — 38 items
APL SKNCLS STERI-STRIP NONHPOA (GAUZE/BANDAGES/DRESSINGS) ×1
BENZOIN TINCTURE PRP APPL 2/3 (GAUZE/BANDAGES/DRESSINGS) ×2 IMPLANT
CHLORAPREP W/TINT 26ML (MISCELLANEOUS) ×3 IMPLANT
CLAMP CORD UMBIL (MISCELLANEOUS) IMPLANT
CLOSURE STERI STRIP 1/2 X4 (GAUZE/BANDAGES/DRESSINGS) ×2 IMPLANT
CLOSURE WOUND 1/2 X4 (GAUZE/BANDAGES/DRESSINGS)
CLOTH BEACON ORANGE TIMEOUT ST (SAFETY) ×3 IMPLANT
DRSG OPSITE POSTOP 4X10 (GAUZE/BANDAGES/DRESSINGS) ×3 IMPLANT
ELECT REM PT RETURN 9FT ADLT (ELECTROSURGICAL) ×3
ELECTRODE REM PT RTRN 9FT ADLT (ELECTROSURGICAL) ×1 IMPLANT
EXTRACTOR VACUUM KIWI (MISCELLANEOUS) IMPLANT
GLOVE BIO SURGEON STRL SZ 6.5 (GLOVE) ×2 IMPLANT
GLOVE BIO SURGEONS STRL SZ 6.5 (GLOVE) ×1
GLOVE BIOGEL PI IND STRL 7.0 (GLOVE) ×1 IMPLANT
GLOVE BIOGEL PI INDICATOR 7.0 (GLOVE) ×2
GOWN STRL REUS W/TWL LRG LVL3 (GOWN DISPOSABLE) ×6 IMPLANT
KIT ABG SYR 3ML LUER SLIP (SYRINGE) IMPLANT
NDL HYPO 25X5/8 SAFETYGLIDE (NEEDLE) IMPLANT
NEEDLE HYPO 25X5/8 SAFETYGLIDE (NEEDLE) IMPLANT
NS IRRIG 1000ML POUR BTL (IV SOLUTION) ×3 IMPLANT
PACK C SECTION WH (CUSTOM PROCEDURE TRAY) ×3 IMPLANT
PAD OB MATERNITY 4.3X12.25 (PERSONAL CARE ITEMS) ×3 IMPLANT
PENCIL SMOKE EVAC W/HOLSTER (ELECTROSURGICAL) ×3 IMPLANT
RTRCTR C-SECT PINK 25CM LRG (MISCELLANEOUS) ×3 IMPLANT
STRIP CLOSURE SKIN 1/2X4 (GAUZE/BANDAGES/DRESSINGS) IMPLANT
SUT CHROMIC 1 CTX 36 (SUTURE) ×6 IMPLANT
SUT PLAIN 0 NONE (SUTURE) IMPLANT
SUT PLAIN 2 0 XLH (SUTURE) ×5 IMPLANT
SUT VIC AB 0 CT1 27 (SUTURE) ×6
SUT VIC AB 0 CT1 27XBRD ANBCTR (SUTURE) ×2 IMPLANT
SUT VIC AB 2-0 CT1 27 (SUTURE) ×3
SUT VIC AB 2-0 CT1 TAPERPNT 27 (SUTURE) ×1 IMPLANT
SUT VIC AB 3-0 CT1 27 (SUTURE)
SUT VIC AB 3-0 CT1 TAPERPNT 27 (SUTURE) IMPLANT
SUT VIC AB 4-0 KS 27 (SUTURE) ×3 IMPLANT
TOWEL OR 17X24 6PK STRL BLUE (TOWEL DISPOSABLE) ×3 IMPLANT
TRAY FOLEY BAG SILVER LF 14FR (SET/KITS/TRAYS/PACK) ×3 IMPLANT
WATER STERILE IRR 1000ML POUR (IV SOLUTION) ×2 IMPLANT

## 2017-02-02 NOTE — Addendum Note (Signed)
Addendum  created 02/02/17 1324 by Bethena Midgetddono, [place] Mccuen, MD   Order list changed, Order sets accessed

## 2017-02-02 NOTE — Anesthesia Postprocedure Evaluation (Signed)
Anesthesia Post Note  Patient: Grace Simpson Thibault  Procedure(s) Performed: CESAREAN SECTION (N/A )     Patient location during evaluation: PACU Anesthesia Type: Spinal Level of consciousness: oriented and awake and alert Pain management: pain level controlled Vital Signs Assessment: post-procedure vital signs reviewed and stable Respiratory status: spontaneous breathing, respiratory function stable and patient connected to nasal cannula oxygen Cardiovascular status: blood pressure returned to baseline and stable Postop Assessment: no headache, no backache and no apparent nausea or vomiting Anesthetic complications: no    Last Vitals:  Vitals:   02/02/17 1021 02/02/17 1030  BP: 117/64 115/74  Pulse:  75  Resp: 16 18  Temp:  (!) 36.4 C  SpO2:  100%    Last Pain:  Vitals:   02/02/17 1030  TempSrc: Axillary   Pain Goal:                 Donevin Sainsbury

## 2017-02-02 NOTE — Consult Note (Signed)
Neonatology Note:   Attendance at C-section:    I was asked by Dr. Senaida Oresichardson to attend this repeat C/S at term. The mother is a G3P2 B neg, GBS positive with history of thromboembolism, on Lovenox, morbid obesity, history of HSV (Valtrex suppression). ROM at delivery, fluid clear. Infant vigorous with good spontaneous cry and tone. Delayed cord clamping was done. Needed only minimal bulb suctioning. Ap 9/9. Lungs clear to ausc in DR. To CN to care of Pediatrician.   Grace Souhristie C. Atoya Andrew, MD

## 2017-02-02 NOTE — Anesthesia Procedure Notes (Signed)
Spinal  Patient location during procedure: OR Start time: 02/02/2017 7:26 AM End time: 02/02/2017 7:33 AM Staffing Anesthesiologist: Bethena Midgetddono, Ernest, MD Performed: anesthesiologist  Preanesthetic Checklist Completed: patient identified, site marked, surgical consent, pre-op evaluation, IV checked, risks and benefits discussed and monitors and equipment checked Spinal Block Patient position: sitting Prep: ChloraPrep Patient monitoring: heart rate, continuous pulse ox and blood pressure Approach: midline Location: L2-3 Injection technique: single-shot Needle Needle gauge: 24 G Needle length: 120 mm. Assessment Sensory level: T4 Additional Notes 2nd attempt

## 2017-02-02 NOTE — Progress Notes (Signed)
Patient ID: Grace Simpson, female   DOB: 04/23/1985, 31 y.o.   MRN: [id number] Pt here for scheduled repeat c-section.  Off lovenox for 36 hours and coags WNL.  Brief exam WNL, pt ready to proceed.

## 2017-02-02 NOTE — Addendum Note (Signed)
Addendum  created 02/02/17 1638 by Jhonnie GarnerMarshall, Farah Lepak M, CRNA   Sign clinical note

## 2017-02-02 NOTE — Op Note (Signed)
Operative Note    Preoperative Diagnosis Term pregnancy at 39+ weeks Prior c-section x 2 Morbid obesity History of pulmonary embolism  Postoperative Diagnosis same  Procedure Repeat low transverse c-section with 2 layer closure  Surgeon Huel CoteKathy Guenther Dunshee, MD Genice Rougeracey Tucker, RNFA  Anesthesia Spinal  Fluids: EBL 800mL  UOP 200mL clear IVF 2200mL LR  Findings A viable female infant in the vertex presentattion.  Apgars 9,9.  Weight pending.  Normal uterus ovaries and tubes  Specimen Placenta to L&D  Procedure Note Patient was taken to the operating room where spinal anesthesia was obtained and found to be adequate by Allis clamp test. She was prepped and draped in the normal sterile fashion in the dorsal supine position with a leftward tilt. An appropriate time out was performed. A Pfannenstiel skin incision was then made through a pre-existing scar with the scalpel and carried through to the underlying layer of fascia by sharp dissection and Bovie cautery. The fascia was nicked in the midline and the incision was extended laterally with Mayo scissors. The inferior aspect of the incision was grasped Coker clamps and dissected off the underlying rectus muscles. In a similar fashion the superior aspect was dissected off the rectus muscles. Rectus muscles were separated in the midline and the peritoneal cavity entered bluntly. The peritoneal incision was then extended both superiorly and inferiorly with careful attention to avoid both bowel and bladder. The Alexis self-retaining wound retractor was then placed within the incision and the lower uterine segment exposed. The bladder flap was developed with Metzenbaum scissors and pushed away from the lower uterine segment. The lower uterine segment was then incised in a transverse fashion and the cavity itself entered bluntly. The incision was extended bluntly. The infant's head was then lifted and delivered from the incision without  difficulty. The remainder of the infant delivered and the nose and mouth bulb suctioned with the cord clamped and cut as well. The infant was handed off to the waiting pediatricians. The placenta was then spontaneously expressed from the uterus and the uterus cleared of all clots and debris with moist lap sponge. The uterine incision was then repaired in 2 layers the first layer was a running locked layer of 1-0 chromic and the second an imbricating layer of the same suture. The tubes and ovaries were inspected and the gutters cleared of all clots and debris. The uterine incision was inspected and found to be hemostatic. All instruments and sponges as well as the Alexis retractor were then removed from the abdomen. The rectus muscles and peritoneum were then reapproximated with a running suture of 2-0 Vicryl. The fascia was then closed with 0 Vicryl in a running fashion. Subcutaneous tissue was reapproximated with 3-0 plain in a running fashion. The skin was closed with a subcuticular stitch of 4-0 Vicryl on a Keith needle and then reinforced with benzoin and Steri-Strips. At the conclusion of the procedure all instruments and sponge counts were correct. Patient was taken to the recovery room in good condition with her baby accompanying her skin to skin.

## 2017-02-02 NOTE — Transfer of Care (Signed)
Immediate Anesthesia Transfer of Care Note  Patient: Grace Simpson  Procedure(s) Performed: CESAREAN SECTION (N/A )  Patient Location: PACU  Anesthesia Type:Spinal  Level of Consciousness: awake, alert  and oriented  Airway & Oxygen Therapy: Patient Spontanous Breathing  Post-op Assessment: Report given to RN and Post -op Vital signs reviewed and stable  Post vital signs: Reviewed and stable  Last Vitals:  Vitals:   02/02/17 1021 02/02/17 1030  BP: 117/64 115/74  Pulse:  75  Resp: 16 18  Temp:  (!) 36.4 C  SpO2:  100%    Last Pain:  Vitals:   02/02/17 1030  TempSrc: Axillary         Complications: No apparent anesthesia complications

## 2017-02-02 NOTE — Anesthesia Postprocedure Evaluation (Signed)
Anesthesia Post Note  Patient: Grace Simpson  Procedure(s) Performed: CESAREAN SECTION (N/A )     Patient location during evaluation: Mother Baby Anesthesia Type: Spinal Level of consciousness: awake and alert and oriented Pain management: pain level controlled Vital Signs Assessment: post-procedure vital signs reviewed and stable Respiratory status: spontaneous breathing Cardiovascular status: blood pressure returned to baseline Postop Assessment: no headache, no backache, spinal receding, patient able to bend at knees, no apparent nausea or vomiting and adequate PO intake Anesthetic complications: no    Last Vitals:  Vitals:   02/02/17 1330 02/02/17 1420  BP: 127/73   Pulse: 81   Resp: 18   Temp: 36.7 C   SpO2: 100% 98%    Last Pain:  Vitals:   02/02/17 1330  TempSrc: Oral  PainSc: 2    Pain Goal:                 Sarvesh Meddaugh

## 2017-02-03 LAB — CBC
HEMATOCRIT: 33.3 % — AB (ref 36.0–46.0)
HEMOGLOBIN: 11.4 g/dL — AB (ref 12.0–15.0)
MCH: 31.9 pg (ref 26.0–34.0)
MCHC: 34.2 g/dL (ref 30.0–36.0)
MCV: 93.3 fL (ref 78.0–100.0)
Platelets: 164 10*3/uL (ref 150–400)
RBC: 3.57 MIL/uL — ABNORMAL LOW (ref 3.87–5.11)
RDW: 13.8 % (ref 11.5–15.5)
WBC: 8.4 10*3/uL (ref 4.0–10.5)

## 2017-02-03 LAB — BIRTH TISSUE RECOVERY COLLECTION (PLACENTA DONATION)

## 2017-02-03 NOTE — Progress Notes (Signed)
Subjective: Postpartum Day 1: Cesarean Delivery Patient reports incisional pain, tolerating PO and no problems voiding.  Nl lochia, pain controlled.    Objective: Vital signs in last 24 hours: Temp:  [97.5 F (36.4 C)-98.7 F (37.1 C)] 98.7 F (37.1 C) (12/19 0408) Pulse Rate:  [64-81] 64 (12/19 0408) Resp:  [16-26] 17 (12/19 0408) BP: (105-138)/(58-91) 128/62 (12/19 0408) SpO2:  [98 %-100 %] 98 % (12/19 0650)  Physical Exam:  General: alert and no distress Lochia: appropriate Uterine Fundus: firm Incision: healing well DVT Evaluation: No evidence of DVT seen on physical exam.  Recent Labs    02/01/17 1140 02/03/17 0513  HGB 12.3 11.4*  HCT 36.2 33.3*    Assessment/Plan: Status post Cesarean section. Doing well postoperatively.  Continue current care.  Marypat Kimmet Bovard-Stuckert 02/03/2017, 8:38 AM

## 2017-02-03 NOTE — Clinical Social Work Maternal (Signed)
CLINICAL SOCIAL WORK MATERNAL/CHILD NOTE  Patient Details  Name: Grace Simpson MRN: [id number] Date of Birth: 09/04/1985  Date:  02/03/2017  Clinical Social Worker Initiating Note:  Farris Blash N Danna Casella, LCSW MSW   Date/Time: Initiated:  02/03/17/1014         Child's Name:      Biological Parents:  Mother   Need for Interpreter:  None   Reason for Referral:  Behavioral Health Concerns   Address:  [address] [place] [address]    Phone number:  [phone] (home)     Additional phone number:   Household Members/Support Persons (HM/SP):   Household Member/Support Person 1   HM/SP Name Relationship DOB or Age  HM/SP -1 friend in room, along with other family members    HM/SP -2     HM/SP -3     HM/SP -4     HM/SP -5     HM/SP -6     HM/SP -7     HM/SP -8       Natural Supports (not living in the home): Community, Extended Family, Friends, Immediate Family   Professional Supports:None   Employment:Disabled   Type of Work: disabled due to depression and anxiety   Education:  High school graduate   Homebound arranged:   NA  Financial Resources:Medicaid   Other Resources: Food Stamps , WIC   Cultural/Religious Considerations Which May Impact Care:none  Strengths: Ability to meet basic needs , Compliance with medical plan , Home prepared for child , Understanding of illness, Psychotropic Medications, Pediatrician chosen   Psychotropic Medications:  Other meds(Vistaril)      Pediatrician:    [place] area  Pediatrician List:   [facility] ([address])  [place]   [place]   [place]   [place]   [place]     Pediatrician Fax Number:    Risk Factors/Current Problems: Family/Relationship Issues , Mental Health Concerns    Cognitive State: Able to Concentrate , Goal Oriented , Linear Thinking , Alert , Insightful    Mood/Affect:  Calm , Comfortable , Happy    CSW Assessment: CSW received consult due to score 11 on Edinburgh Depression Screen.   LCSW met with MOB and friend in room. MOB very engaged and interested in consult.  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 She reports she has already started back on her medication as she was having some anxiety 2 weeks prior regarding family issues and FOB not taking part in birth or pregnancy.  MOB reports she has processed and accepted he will not be part of this event and this makes her sad and anxious as she brings baby home.  She reports she has multiple support factors on her life and will be well taken care of at home.  Reports she has all necessary means for baby when discharged from the hospital.  MOB was given resources and education regarding PPD and PPA along with signs and symptoms worksheet to have for her needs at home. She has follow up with her OB and requested referral information regarding therapy and Psychiatrist at discharge. NO appointments to be made, but will put information on AVS for patient to make appointment when she is ready. Her OB will continue to prescribe her mental health medication.  She was also given social media tools that are free in the event she needs additional   support.    CSW provided education regarding Baby Blues vs PMADs and provided MOB with information about support groups held at [facility]. CSW encouraged MOB to evaluate her mental health throughout the postpartum period with the use of the New Mom Checklist developed by Postpartum Progress and notify a medical professional if symptoms arise.  CSW Plan/Description: Other Information/Referral to Community Resources, Perinatal Mood and Anxiety Disorder (PMADs) Education, No Further Intervention Required/No Barriers to Discharge    Soham Hollett N, LCSW 02/03/2017, 10:18 AM             

## 2017-02-03 NOTE — Lactation Note (Signed)
This note was copied from a baby's chart. Lactation Consultation Note Mom's 3rd child. Mom stated she 6 months BF her daughter, she had a high palate. Mom Bf her son 8 months, pumped 4 months. Mom stated she used a NS with him. Mom has long soft pendulous breast w/everted nipple at bottom end of nipple. Mom had #24 NS that wouldn't stay on d/t to large. Explained to mom LC didn't think a NS would stay on d/t position of nipple and tissue to soft to hold NS on. Mom wanted to try a different size. Fitted #20 NS, fits good, mom latched w/LC assistance. Baby wouldn't suckle at breast. Mom stated baby didn't even want to suckle on bottle, baby chews. LC gave baby 4 ml colostrum after working with baby to suck. Suck training w/gloved finger. Then used nipple, finally suckled has poor suck swallow coordination.  Newborn behavior and feeding habits discussed. Mom encouraged to feed baby 8-12 times/24 hours and with feeding cues. Praised mom for pumping.  Mom used BEBP to pump 4 ml. Mom stated baby has done better this time than she has.  Baby took 4 ml colostrum w/stimulation.  Encouraged to call RN if baby doesn't feed. Reported to RN of status.  Mom using DEBP for supplementing w/colostrum.  WH/LC brochure given w/resources, support groups and LC services. Patient Name: Grace Simpson WUJWJ'XToday's Date: 02/03/2017 Reason for consult: Initial assessment   Maternal Data Has patient been taught Hand Expression?: Yes Does the patient have breastfeeding experience prior to this delivery?: Yes  Feeding Feeding Type: Breast Milk Length of feed: 0 min  LATCH Score Latch: Too sleepy or reluctant, no latch achieved, no sucking elicited.  Audible Swallowing: None  Type of Nipple: Everted at rest and after stimulation(short shaft)  Comfort (Breast/Nipple): Soft / non-tender  Hold (Positioning): Full assist, staff holds infant at breast  LATCH Score: 4  Interventions Interventions: Breast feeding  basics reviewed;Breast compression;Assisted with latch;Adjust position;Skin to skin;Support pillows;DEBP;Breast massage;Position options;Hand express;Expressed milk;Pre-pump if needed;Shells  Lactation Tools Discussed/Used Tools: Pump;Nipple Shields Nipple shield size: 20 Breast pump type: Double-Electric Breast Pump   Consult Status Consult Status: Follow-up Date: 02/03/17 Follow-up type: In-patient    Grace Simpson, Diamond NickelLAURA G 02/03/2017, 4:02 AM

## 2017-02-04 NOTE — Lactation Note (Signed)
This note was copied from a baby's chart. Lactation Consultation Note  Patient Name: Grace Bertell MariaKathleen Alicea WUJWJ'XToday's Date: 02/04/2017  Mom reports that baby is latching well without using a nipple shield.  No questions or concerns at present.  Encouraged to call if assist needed.   Maternal Data    Feeding    LATCH Score                   Interventions    Lactation Tools Discussed/Used     Consult Status      Huston FoleyMOULDEN, Naziyah Tieszen S 02/04/2017, 9:40 PM

## 2017-02-04 NOTE — Progress Notes (Signed)
Writer entered pt room to give her 1800 meds and pt sts that for the last few hours she has been feeling "weird and very jittery" and asked if it could be her hormones. Writer advised pt that's possible and ensured that pt had SCD's on and that they were running correctly considering pt hx of PE.  VS 97.6, HR 86, RR 18, 135/73, 100% O2. Adv pt that if she starts to feel any more anxious or "weird" to call out immediately so we can reassess and take necessary action.

## 2017-02-04 NOTE — Progress Notes (Addendum)
Subjective: Postpartum Day 2: Cesarean Delivery Patient reports incisional pain, tolerating PO and no problems voiding.    Objective: Vital signs in last 24 hours: Temp:  [98.3 F (36.8 C)-98.9 F (37.2 C)] 98.3 F (36.8 C) (12/20 0525) Pulse Rate:  [60-79] 79 (12/20 0525) Resp:  [18] 18 (12/20 0525) BP: (135-145)/(70-79) 135/79 (12/20 0525) SpO2:  [99 %-100 %] 100 % (12/20 0525)  Physical Exam:  General: alert and cooperative Lochia: appropriate Uterine Fundus: firm Incision: C/D/I   Recent Labs    02/01/17 1140 02/03/17 0513  HGB 12.3 11.4*  HCT 36.2 33.3*    Assessment/Plan: Status post Cesarean section. Doing well postoperatively. Restarted on lovenox yesterday. SW has seen patient and we have a plan for increased risk of pp depression Pt is single mom with some support, will stay with her adoptive father after d/c, she will stay one more day to work on breastfeeding and mobility. Continue lovenox x 6 weeks--has at home already  Continue current care.  Oliver PilaKathy W Lea Walbert 02/04/2017, 7:42 AM

## 2017-02-04 NOTE — Progress Notes (Signed)
Patient called out for nurse.  When this nurse entered room, patient was sobbing.  Said back pain was an "8", and hurt "really bad."  Stated heat was no longer helping.  Confirmed that she had taken 10 mg Oxy/ir and tylenol this morning and it had helped, but had not had any more during the day.  Gave her 10 mg oxyir and tylenol and cold pack for her back (which she was willing to try).   She also indicated she has had little sleep during her stay.  She is alone in the room.  Prefacing that it depended on how busy the nursery nurses were, this nurse offered to bring baby to nursery for a bit so that she could get some sleep.  She appreciated the offer, but declined at this time.  Baby has been breastfeeding well.   Will reassess mother at 2130 for pain relief.Murtis Sink.  Jtwells, rn

## 2017-02-04 NOTE — Progress Notes (Signed)
On reassessment, patient states pain is slowly decreasing; ice packs seem to be helping.  States pain comes in waves; she pointed to her right buttocks area as to where the pain was.  She is going to get up and sit in chair; has been mostly in her bed.  Will reassess within the hour.  Jtwells, rn

## 2017-02-05 NOTE — Discharge Summary (Signed)
OB Discharge Summary     Patient Name: Grace Simpson DOB: 04/09/1985 MRN: [id number]  Date of admission: 02/02/2017 Delivering MD: Huel CoteICHARDSON, KATHY   Date of discharge: 02/05/2017  Admitting diagnosis: repeat c-section  Intrauterine pregnancy: 2029w1d     Secondary diagnosis:  Active Problems:   Hypothyroidism   OBESITY, MORBID   S/P cesarean section   Pregnant and not yet delivered in third trimester   History of pulmonary embolism   S/P repeat low transverse C-section  Additional problems: none     Discharge diagnosis: Term Pregnancy Delivered                                                                                                Post partum procedures:none  Augmentation: n/a  Complications: None  Hospital course:  Sceduled C/S   31 y.o. yo G3P3003 at 2129w1d was admitted to the hospital 02/02/2017 for scheduled cesarean section with the following indication:repeat x 3, morbid obesity.  Membrane Rupture Time/Date: 8:00 AM ,02/02/2017   Patient delivered a Viable infant.02/02/2017  Details of operation can be found in separate operative note.  Pateint had an uncomplicated postpartum course.  She is ambulating, tolerating a regular diet, passing flatus, and urinating well. Patient is discharged home in stable condition on  02/05/17         Physical exam  Vitals:   02/03/17 1843 02/04/17 0525 02/04/17 1744 02/05/17 0630  BP: (!) 145/70 135/79 135/73 137/74  Pulse: 60 79 86 68  Resp: 18 18 18 18   Temp: 98.9 F (37.2 C) 98.3 F (36.8 C) 97.6 F (36.4 C) 98.5 F (36.9 C)  TempSrc: Oral Oral Oral   SpO2: 99% 100% 100% 97%  Weight:      Height:       General: alert, cooperative and no distress Lochia: appropriate Uterine Fundus: firm Incision: Healing well with no significant drainage, Dressing is clean, dry, and intact DVT Evaluation: No evidence of DVT seen on physical exam. Labs: Lab Results  Component Value Date   WBC 8.4 02/03/2017   HGB 11.4 (L)  02/03/2017   HCT 33.3 (L) 02/03/2017   MCV 93.3 02/03/2017   PLT 164 02/03/2017   CMP Latest Ref Rng & Units 05/13/2015  Glucose 65 - 99 mg/dL 91  BUN 6 - 20 mg/dL 6  Creatinine 4.090.44 - 8.111.00 mg/dL 9.140.77  Sodium 782135 - 956145 mmol/L 141  Potassium 3.5 - 5.1 mmol/L 3.8  Chloride 101 - 111 mmol/L 106  CO2 22 - 32 mmol/L 26  Calcium 8.9 - 10.3 mg/dL 9.2  Total Protein 6.0 - 8.3 g/dL -  Total Bilirubin 0.3 - 1.2 mg/dL -  Alkaline Phos 39 - 213117 U/L -  AST 0 - 37 U/L -  ALT 0 - 35 U/L -    Discharge instruction: per After Visit Summary and "Baby and Me Booklet".  After visit meds:  Allergies as of 02/05/2017      Reactions   Corticosteroids Other (See Comments)   Adverse effect, jitttery   Food    Melon-swelling (lips/tongue) itching   Diphenhydramine Hcl Other (  See Comments)   Adverse effect. jittery   Sulfa Antibiotics Hives      Medication List    TAKE these medications   DICLEGIS 10-10 MG Tbec Generic drug:  Doxylamine-Pyridoxine Take 2 tablets by mouth at bedtime.   enoxaparin 120 MG/0.8ML injection Commonly known as:  LOVENOX Inject 120 mg into the skin every 12 (twelve) hours.   hydrOXYzine 25 MG capsule Commonly known as:  VISTARIL Take 25 mg by mouth 3 (three) times daily as needed for anxiety.   levothyroxine 137 MCG tablet Commonly known as:  SYNTHROID, LEVOTHROID Take 137 mcg by mouth daily before breakfast.   loratadine 10 MG tablet Commonly known as:  CLARITIN Take 10 mg by mouth daily as needed for allergies.   omeprazole 40 MG capsule Commonly known as:  PRILOSEC TAKE ONE CAPSULE BY MOUTH DAILY What changed:  Another medication with the same name was removed. Continue taking this medication, and follow the directions you see here.   oxymetazoline 0.05 % nasal spray Commonly known as:  AFRIN Place 1 spray into both nostrils daily as needed for congestion.       Diet: routine diet  Activity: Advance as tolerated. Pelvic rest for 6 weeks.    Outpatient follow up: incision check in two weeks and pp visit in 6weeks  Follow up Appt:No future appointments. Follow up Visit:No Follow-up on file.  Postpartum contraception: Not Discussed  Newborn Data: Live born female  Birth Weight: 6 lb 15.3 oz (3155 g) APGAR: 9, 9  Newborn Delivery   Birth date/time:  02/02/2017 08:00:00 Delivery type:  C-Section, Low Transverse C-section categorization:  Repeat     Baby Feeding: Breast Disposition:home with mother   02/05/2017 Cathrine Musterecilia W Banga, DO

## 2017-02-05 NOTE — Discharge Instructions (Signed)
Nothing in vagina for 6 weeks.  No sex, tampons, and douching.  Other instructions as in [facility] Discharge Booklet. °

## 2017-02-05 NOTE — Lactation Note (Signed)
This note was copied from a baby's chart. Lactation Consultation Note  Patient Name: Grace Simpson's Date: 02/05/2017 Reason for consult: Follow-up assessment   Baby 4274 hours old and mother denies questions or problems. Baby recently bf for 20 min. Mom encouraged to feed baby 8-12 times/24 hours and with feeding cues.  Reviewed engorgement care and monitoring voids/stools.    Maternal Data    Feeding Feeding Type: Breast Fed Length of feed: 20 min  LATCH Score                   Interventions    Lactation Tools Discussed/Used     Consult Status Consult Status: Complete    Hardie PulleyBerkelhammer, Quorra Rosene Boschen 02/05/2017, 10:56 AM

## 2017-02-05 NOTE — Progress Notes (Signed)
Patient ID: Grace Simpson, female   DOB: 03/31/1985, 31 y.o.   MRN: [id number] Pt doing well. Pain better controlled today. Reports back spasms occasionally but incision pain controlled. Lochia scant. No fever or chills. Bonding well with daughter, Grace Simpson. Ready for discharge to home today VSS ABD - FF, soft , dressing c/d/i EXT - no edema or homans  A/P: POD#3 s/p repeat c/s- stable         Discharge instructions reviewed - incision check in two weeks and pp visit in 6weeks          Continue on lovenox x 6 weeks

## 2017-03-25 IMAGING — CT CT ANGIO CHEST
2 of 6 series · 19 of 36 positions shown · IV contrast (OMNIPAQUE 350)
Comparison: 06/14/2014

CLINICAL DATA: Chest pain and shortness of breath for 2 days.
History of pulmonary embolism.

EXAM:
CT ANGIOGRAPHY CHEST WITH CONTRAST
TECHNIQUE: Multidetector CT imaging of the chest was performed using the
standard protocol during bolus administration of intravenous
contrast. Multiplanar CT image reconstructions and MIPs were
obtained to evaluate the vascular anatomy.
CONTRAST:  100mL OMNIPAQUE IOHEXOL 350 MG/ML SOLN

[Series 6: thins for pacs · axial · 0.65mm/px · z∈[+1350,+1550]mm · 18 of 224 slices shown]
[im 12/224  lung]
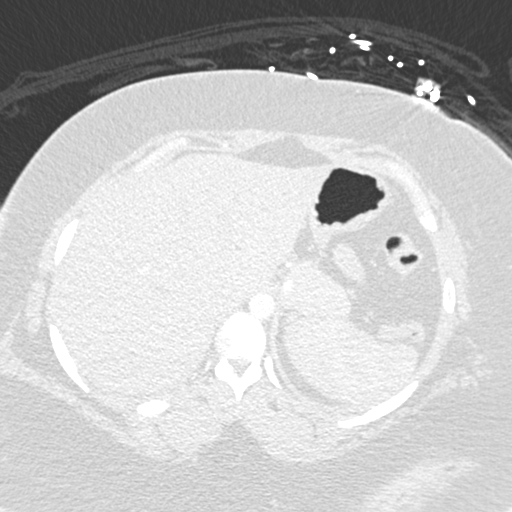
[im 23/224  mediastinal]
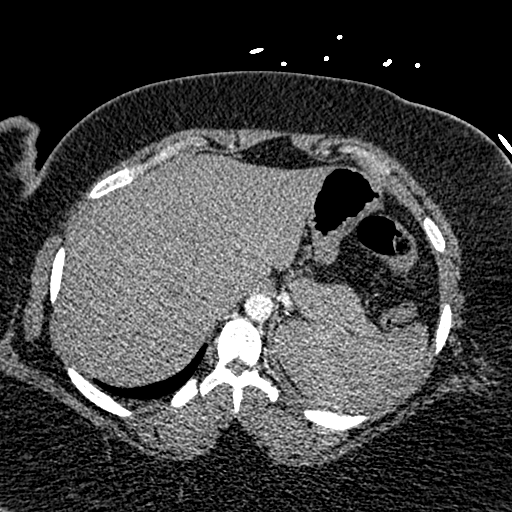
[im 34/224  lung]
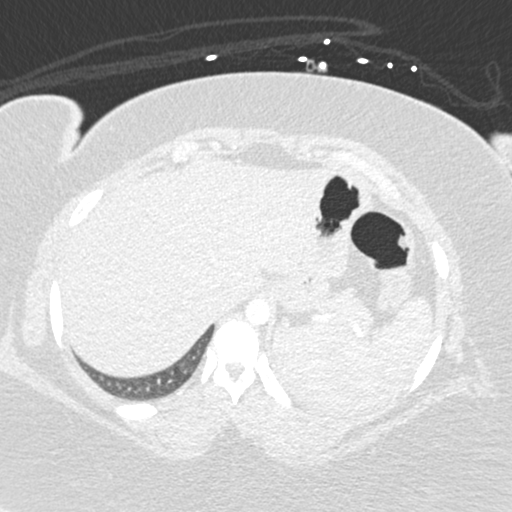
[im 45/224  mediastinal]
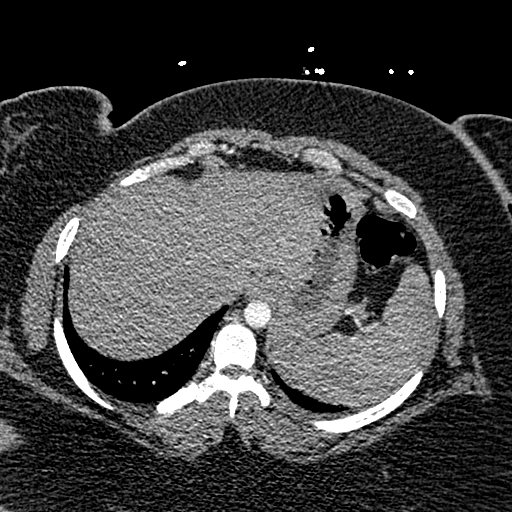
[im 56/224  lung]
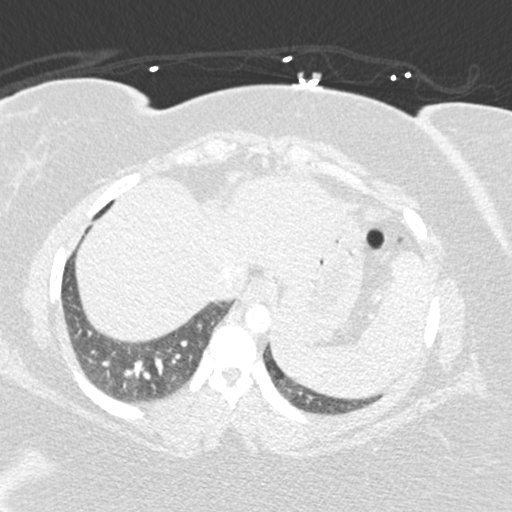
[im 67/224  mediastinal]
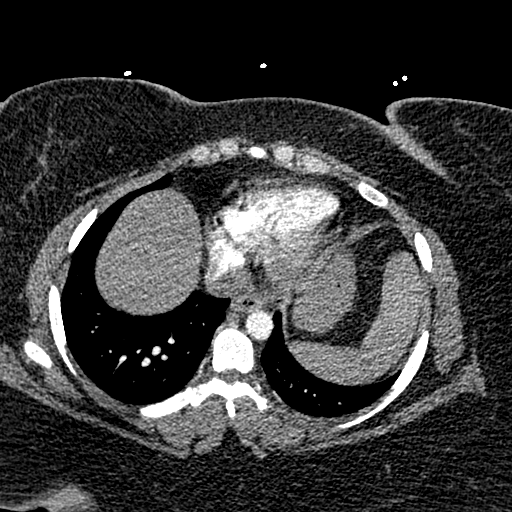
[im 79/224  lung]
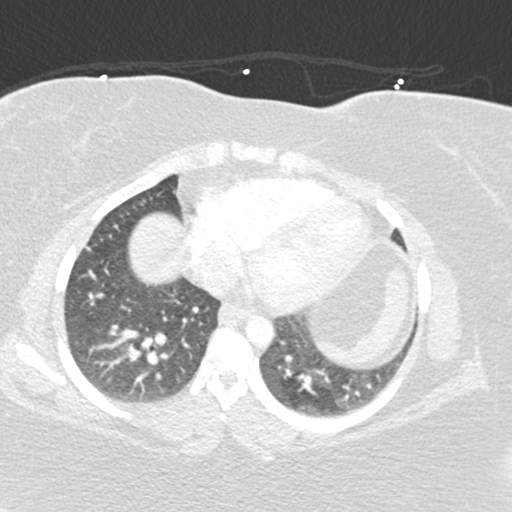
[im 90/224  mediastinal]
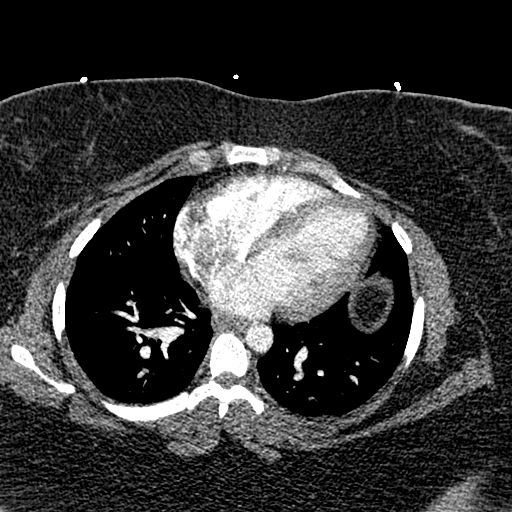
[im 101/224  lung]
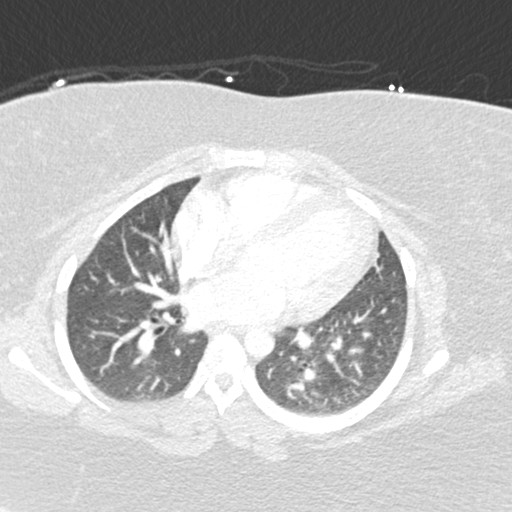
[im 123/224  mediastinal]
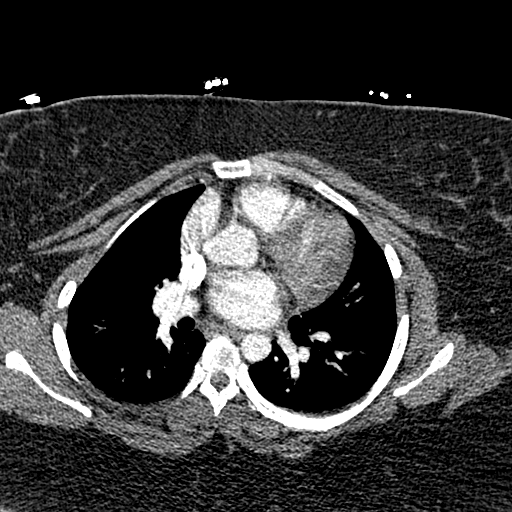
[im 134/224  lung]
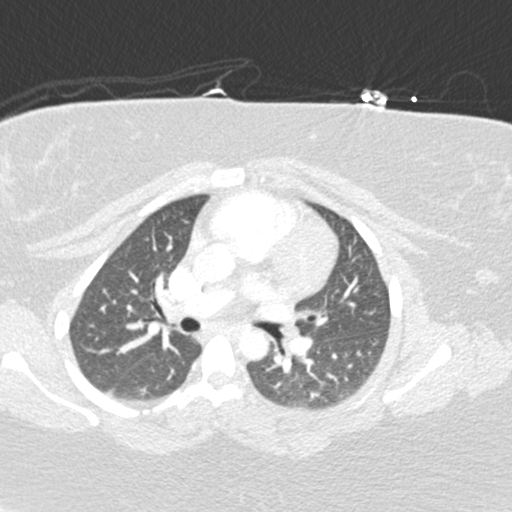
[im 145/224  mediastinal]
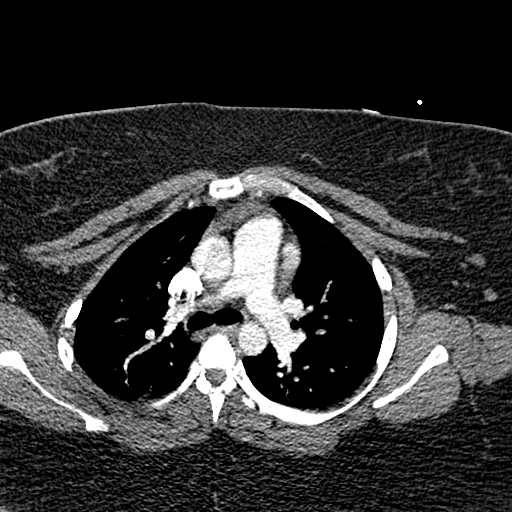
[im 157/224  lung]
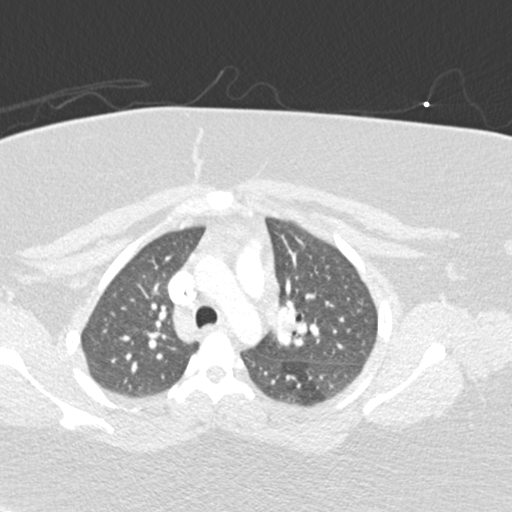
[im 168/224  mediastinal]
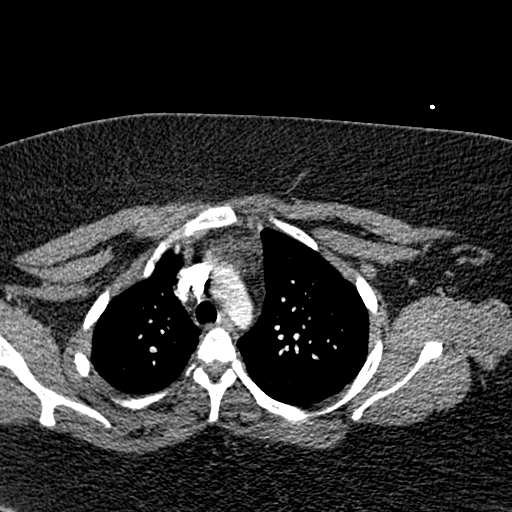
[im 179/224  lung]
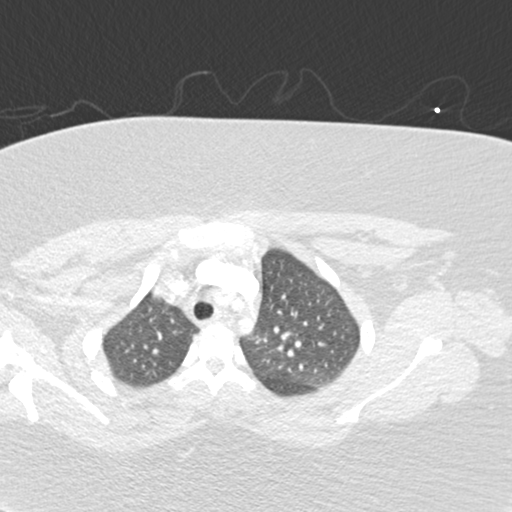
[im 190/224  mediastinal]
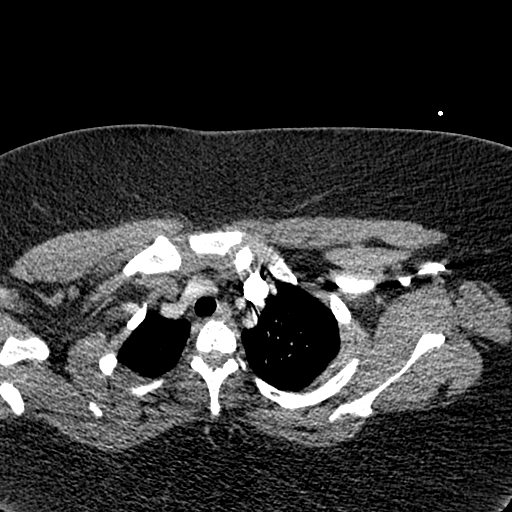
[im 201/224  lung]
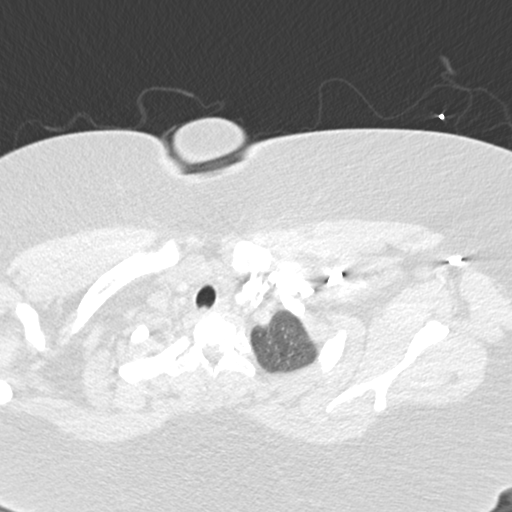
[im 212/224  mediastinal]
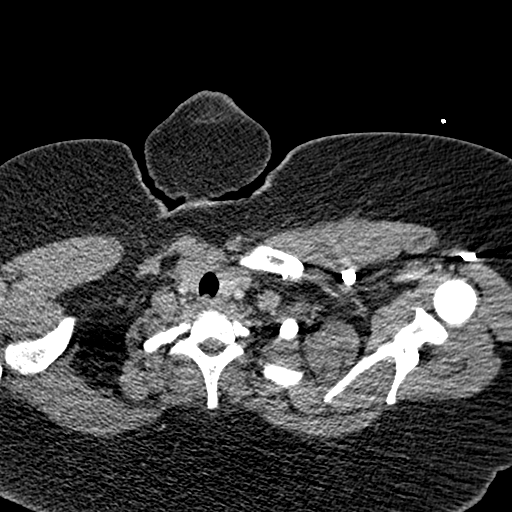

[Series 8: coronal mpr · coronal · 0.49mm/px · 1 of 138 slices shown]
[im 69/138  mediastinal]
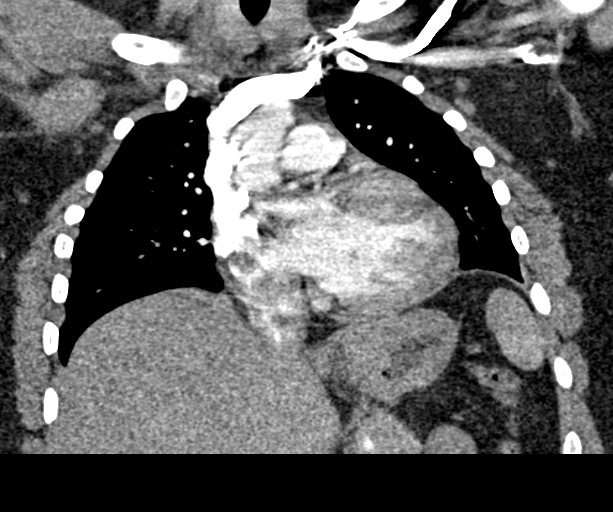

[19 of 36 positions shown; findings below may reference images not displayed]

FINDINGS: No pulmonary arterial emboli are identified, although subsegmental
branches are suboptimally evaluated due to suboptimal contrast
opacification and patient body habitus.

No enlarged axillary, mediastinal, or hilar lymph nodes are
identified. A small amount of soft tissue in the anterior superior
mediastinum is unchanged and likely represents residual thymus.
There is no pleural or pericardial effusion. No lung consolidation
or nodules are identified. Major airways are patent.

Visualized portion of the upper abdomen is unremarkable. No acute
osseous abnormality is seen.

Review of the MIP images confirms the above findings.
IMPRESSION: No pulmonary emboli identified, with suboptimal evaluation of
subsegmental branches.

## 2019-01-24 ENCOUNTER — Other Ambulatory Visit: Payer: Self-pay

## 2019-01-24 DIAGNOSIS — Z20822 Contact with and (suspected) exposure to covid-19: Secondary | ICD-10-CM

## 2019-01-25 LAB — NOVEL CORONAVIRUS, NAA: SARS-CoV-2, NAA: NOT DETECTED

## 2020-01-27 ENCOUNTER — Emergency Department (HOSPITAL_BASED_OUTPATIENT_CLINIC_OR_DEPARTMENT_OTHER): Payer: Medicaid Other

## 2020-01-27 ENCOUNTER — Emergency Department (HOSPITAL_BASED_OUTPATIENT_CLINIC_OR_DEPARTMENT_OTHER)
Admission: EM | Admit: 2020-01-27 | Discharge: 2020-01-28 | Disposition: A | Discharge status: Home / Self Care | Payer: Medicaid Other | Attending: Emergency Medicine | Admitting: Emergency Medicine

## 2020-01-27 ENCOUNTER — Encounter (HOSPITAL_BASED_OUTPATIENT_CLINIC_OR_DEPARTMENT_OTHER): Payer: Self-pay | Admitting: Emergency Medicine

## 2020-01-27 ENCOUNTER — Other Ambulatory Visit: Payer: Self-pay

## 2020-01-27 DIAGNOSIS — Z87891 Personal history of nicotine dependence: Secondary | ICD-10-CM | POA: Insufficient documentation

## 2020-01-27 DIAGNOSIS — J4540 Moderate persistent asthma, uncomplicated: Secondary | ICD-10-CM | POA: Diagnosis not present

## 2020-01-27 DIAGNOSIS — R079 Chest pain, unspecified: Secondary | ICD-10-CM

## 2020-01-27 DIAGNOSIS — E039 Hypothyroidism, unspecified: Secondary | ICD-10-CM | POA: Insufficient documentation

## 2020-01-27 DIAGNOSIS — I10 Essential (primary) hypertension: Secondary | ICD-10-CM | POA: Diagnosis not present

## 2020-01-27 DIAGNOSIS — Z79899 Other long term (current) drug therapy: Secondary | ICD-10-CM | POA: Insufficient documentation

## 2020-01-27 DIAGNOSIS — Z86711 Personal history of pulmonary embolism: Secondary | ICD-10-CM | POA: Diagnosis not present

## 2020-01-27 DIAGNOSIS — R0602 Shortness of breath: Secondary | ICD-10-CM | POA: Diagnosis not present

## 2020-01-27 LAB — TROPONIN I (HIGH SENSITIVITY): Troponin I (High Sensitivity): 3 ng/L (ref <18)

## 2020-01-27 LAB — CBC
HCT: 42.4 % (ref 36.0–46.0)
Hemoglobin: 14.3 g/dL (ref 12.0–15.0)
MCH: 31.2 pg (ref 26.0–34.0)
MCHC: 33.7 g/dL (ref 30.0–36.0)
MCV: 92.4 fL (ref 80.0–100.0)
Platelets: 293 10*3/uL (ref 150–400)
RBC: 4.59 MIL/uL (ref 3.87–5.11)
RDW: 14.3 % (ref 11.5–15.5)
WBC: 9 10*3/uL (ref 4.0–10.5)
nRBC: 0 % (ref 0.0–0.2)

## 2020-01-27 LAB — BASIC METABOLIC PANEL
Anion gap: 8 (ref 5–15)
BUN: 8 mg/dL (ref 6–20)
CO2: 25 mmol/L (ref 22–32)
Calcium: 9.3 mg/dL (ref 8.9–10.3)
Chloride: 103 mmol/L (ref 98–111)
Creatinine, Ser: 0.77 mg/dL (ref 0.44–1.00)
GFR, Estimated: >60 mL/min (ref >60)
Glucose, Bld: 101 mg/dL — ABNORMAL HIGH (ref 70–99)
Potassium: 3.6 mmol/L (ref 3.5–5.1)
Sodium: 136 mmol/L (ref 135–145)

## 2020-01-27 LAB — PREGNANCY, URINE: Preg Test, Ur: NEGATIVE

## 2020-01-27 MED ORDER — IOHEXOL 350 MG/ML SOLN
100.0000 mL | Freq: Once | INTRAVENOUS | Status: AC | PRN
  Administered 2020-01-27: 23:00:00 100 mL via INTRAVENOUS

## 2020-01-27 NOTE — ED Provider Notes (Signed)
[facility] EMERGENCY DEPARTMENT Provider Note   CSN: [id number] Arrival date & time: 01/27/20  6301     History Chief Complaint  Patient presents with  . Chest Pain    Grace Simpson is a 34 y.o. female with PMH of hypothyroidism, bipolar affective disorder, history of pulmonary embolism, migraine disorder, anxiety, and morbid obesity who presents to the ED with complaints of left-sided chest pain.  Patient has been placed on a muscle relaxant when evaluated in urgent care, but symptoms have not improved.  She is concerned given her history of pulmonary embolism, not on any anticoagulation.  I reviewed patient's medical record and she was admitted on 06/14/2014 after she was noted to have small segmental pulmonary embolism on CT scanning.  She was on oral contraceptive therapy at that time.  She was subsequently discharged from the hospital on Xarelto for 6 months.  On my examination, patient reports that a few weeks ago she was seen in urgent care for what she thought was a musculoskeletal strain/spasm.  She was discharged home with muscle relaxants.  However, her symptoms have failed to improve.  In fact, yesterday she feels as though it has moved to her chest and she had some left arm numbness with associated shortness of breath earlier today.  She states that this feels similar to her pulmonary embolism.  She has intermittent pleuritic symptoms, but states that her prior PE was worse.  She states that previously, she could not even take a breath without having significant pain symptoms.  She also reports that she has a history of panic disorder and is unsure as to whether or not her anxiety is playing a role.  However, she states that she would not be able to rest comfortably until she knows for sure that she does not have a pulmonary embolism.  She has 3 children at home and this is concerning enough to of prompting her to come to the ED for evaluation.  She also reports that not too  long ago she was seen in urgent care for atraumatic right calf discomfort and was encouraged to obtain ultrasound, but she never went.  She denies any fevers or chills, recent numbness or infection, diaphoresis, nausea vomiting, abdominal pain, or other symptoms.  HPI     Past Medical History:  Diagnosis Date  . Anemia   . Anxiety    no meds currently  . Anxiety   . Bipolar affective (HCC)   . BV (bacterial vaginosis)   . 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 GERD (gastroesophageal reflux disease)   . Headache(784.0)   . Hemorrhoids   . Herpes    last outbreak 4 months ago  . History of pyelonephritis   . Hx pulmonary embolism 05/2014  . Hyperprolactinemia (HCC)   . Hypertension   . Hypothyroidism   . Left breast abscess    not MRSA  . Migraines   . Obesity, morbid (more than 100 lbs over ideal weight or BMI > 40) (HCC)   . Obstructive sleep apnea    does not use CPAP  . OCD (obsessive compulsive disorder)   . Oppositional defiant disorder   . Pregnancy induced hypertension    unsure if PIH or CHTN - no meds  . Pseudotumor cerebri    increased CSFi pressure on optic nerve, back to nl level  . Pyelonephritis    history - no current problem  . S/P cesarean section 10/23/2012  .  Seasonal allergies   . Trichomonas   . UTI (lower urinary tract infection)     Patient Active Problem List   Diagnosis Date Noted  . Pregnant and not yet delivered in third trimester 02/02/2017  . History of pulmonary embolism 02/02/2017  . S/P repeat low transverse C-section 02/02/2017  . Chronic rhinitis 01/04/2015  . Moderate persistent asthma 12/03/2014  . GERD (gastroesophageal reflux disease) 12/03/2014  . Pulmonary embolism (HCC) 06/14/2014  . Acute pulmonary embolism (HCC) 06/14/2014  . S/P cesarean section 10/23/2012  . Chest pain 09/21/2012  . Dyspnea 09/21/2012  . Hypothyroidism 03/08/2009  . OBSTRUCTIVE SLEEP APNEA 03/08/2009  . Other allergic  rhinitis 03/08/2009  . OBESITY, MORBID 09/13/2008  . BIPOLAR DISORDER UNSPECIFIED 09/13/2008  . OBSESSIVE-COMPULSIVE DISORDERS 09/13/2008  . OPPOSITIONAL DEFIANT DISORDER 09/13/2008  . ADD 09/13/2008    Past Surgical History:  Procedure Laterality Date  . ADENOIDECTOMY    . CESAREAN SECTION  11/20/2010   Procedure: CESAREAN SECTION;  Surgeon: Oliver Pila;  Location: [facility];  Service: Gynecology;  Laterality: N/A;  . CESAREAN SECTION N/A 10/21/2012   Procedure: REPEAT CESAREAN SECTION;  Surgeon: Oliver Pila, MD;  Location: [facility];  Service: Obstetrics;  Laterality: N/A;  . CESAREAN SECTION N/A 02/02/2017   Procedure: CESAREAN SECTION;  Surgeon: Huel Cote, MD;  Location: [facility];  Service: Obstetrics;  Laterality: N/A;  Tracey RNFA assist and 2 hours in OR  . CHOLECYSTECTOMY  2009  . left breast abcess Left 2010  . TONSILLECTOMY  1997  . WISDOM TOOTH EXTRACTION       OB History    Gravida  3   Para  3   Term  3   Preterm  0   AB  0   Living  3     SAB  0   IAB  0   Ectopic  0   Multiple  0   Live Births  3           Family History  Adopted: Yes  Family history unknown: Yes    Social History   Tobacco Use  . Smoking status: Former Smoker    Packs/day: 0.50    Types: Cigarettes    Quit date: 05/30/2016    Years since quitting: 3.6  . Smokeless tobacco: Never Used  Substance Use Topics  . Alcohol use: No    Alcohol/week: 0.0 standard drinks  . Drug use: No    Home Medications Prior to Admission medications   Medication Sig Start Date End Date Taking? Authorizing Provider  Doxylamine-Pyridoxine 10-10 MG TBEC Take 2 tablets by mouth at bedtime.    [provider]  enoxaparin (LOVENOX) 120 MG/0.8ML injection Inject 120 mg into the skin every 12 (twelve) hours.    [provider]  hydrOXYzine (VISTARIL) 25 MG capsule Take 25 mg by mouth 3 (three) times daily as needed for anxiety.     [provider]  levothyroxine (SYNTHROID, LEVOTHROID) 137 MCG tablet Take 137 mcg by mouth daily before breakfast.    [provider]  loratadine (CLARITIN) 10 MG tablet Take 10 mg by mouth daily as needed for allergies.     [provider]  omeprazole (PRILOSEC) 40 MG capsule TAKE ONE CAPSULE BY MOUTH DAILY Patient not taking: No sig reported 05/28/15   Bobbitt, Heywood Iles, MD  oxymetazoline (AFRIN) 0.05 % nasal spray Place 1 spray into both nostrils daily as needed for congestion.    [provider]    Allergies    Corticosteroids, Food, Diphenhydramine hcl, and Sulfa antibiotics  Review of Systems   Review of Systems  All other systems reviewed and are negative.   Physical Exam Updated Vital Signs BP (!) 186/115   Pulse 82   Temp 98.6 F (37 C) (Oral)   Resp 19   Ht 5' (1.524 m)   Wt 114.3 kg   LMP 01/05/2020   SpO2 99%   BMI 49.22 kg/m   Physical Exam Vitals and nursing note reviewed. Exam conducted with a chaperone present.  Constitutional:      General: She is not in acute distress.    Appearance: She is obese. She is not toxic-appearing.  HENT:     Head: Normocephalic and atraumatic.  Eyes:     General: No scleral icterus.    Conjunctiva/sclera: Conjunctivae normal.  Cardiovascular:     Rate and Rhythm: Normal rate and regular rhythm.     Pulses: Normal pulses.     Heart sounds: Normal heart sounds.     Comments: Heart rate in low 90s on my exam.  Regular. Pulmonary:     Effort: Pulmonary effort is normal. No respiratory distress.     Breath sounds: Normal breath sounds. No wheezing or rales.  Musculoskeletal:     Right lower leg: No edema.     Left lower leg: No edema.     Comments: No unilateral extremity swelling, edema, or overlying skin changes.  No tenderness to palpation.  Moves all extremities with strength intact against resistance.  Sensation intact throughout.  Skin:    General: Skin is dry.     Capillary Refill:  Capillary refill takes less than 2 seconds.  Neurological:     General: No focal deficit present.     Mental Status: She is alert and oriented to person, place, and time.     GCS: GCS eye subscore is 4. GCS verbal subscore is 5. GCS motor subscore is 6.     Cranial Nerves: No cranial nerve deficit.     Sensory: No sensory deficit.     Motor: No weakness.     Coordination: Coordination normal.     Comments: While she endorsed her left arm numbness, her sensation is entirely intact and symmetric with contralateral arm.  Psychiatric:        Mood and Affect: Mood normal.        Behavior: Behavior normal.        Thought Content: Thought content normal.     ED Results / Procedures / Treatments   Labs (all labs ordered are listed, but only abnormal results are displayed) Labs Reviewed  BASIC METABOLIC PANEL - Abnormal; Notable for the following components:      Result Value   Glucose, Bld 101 (*)    All other components within normal limits  CBC  PREGNANCY, URINE  TROPONIN I (HIGH SENSITIVITY)    EKG EKG Interpretation  Date/Time:  Saturday January 27 2020 18:24:10 EST Ventricular Rate:  116 PR Interval:  124 QRS Duration: 64 QT Interval:  300 QTC Calculation: 417 R Axis:   35 Text Interpretation: Sinus tachycardia Septal infarct , age undetermined Abnormal ECG Confirmed by Vanetta MuldersZackowski, Scott [id number]) on 01/27/2020 10:40:17 PM   Radiology DG Chest 2 View  Result Date: 01/27/2020 CLINICAL DATA:  Chest pain EXAM: CHEST - 2 VIEW COMPARISON:  02/12/2015 FINDINGS: The heart size and mediastinal contours are within normal limits. Both lungs are clear. The visualized skeletal  structures are unremarkable. IMPRESSION: Negative. Electronically Signed   By: Charlett Nose M.D.   On: 01/27/2020 19:13   CT Angio Chest PE W and/or Wo Contrast  Result Date: 01/27/2020 CLINICAL DATA:  PE suspected, high prob hx PE, feels similar. Left-sided chest pain. EXAM: CT ANGIOGRAPHY CHEST WITH  CONTRAST TECHNIQUE: Multidetector CT imaging of the chest was performed using the standard protocol during bolus administration of intravenous contrast. Multiplanar CT image reconstructions and MIPs were obtained to evaluate the vascular anatomy. CONTRAST:  OMNIPAQUE IOHEXOL 350 MG/ML SOLN COMPARISON:  Radiograph earlier today. Most recent chest CTA 08/22/2014 FINDINGS: Cardiovascular: There are no filling defects within the pulmonary arteries to suggest pulmonary embolus. Thoracic aorta is normal in caliber. No dissection or acute aortic abnormality. No aneurysm. Common origin of the brachiocephalic and left common carotid artery, variant arch anatomy. There is no pericardial effusion. The heart is normal in size. Mediastinum/Nodes: Shotty right hilar node measuring 10 mm short axis. No mediastinal or left hilar adenopathy. No visualized thyroid nodule. There is no axillary adenopathy. No esophageal wall thickening. Tiny hiatal hernia. Lungs/Pleura: Minimal linear subsegmental scarring in the right middle lobe. The lungs are otherwise clear. No acute or focal airspace disease. No pulmonary edema. No pleural fluid. The trachea and central bronchi are patent. Upper Abdomen: Suspected hepatic steatosis. Cholecystectomy. No acute upper abdominal findings. Musculoskeletal: There are no acute or suspicious osseous abnormalities. Review of the MIP images confirms the above findings. IMPRESSION: 1. No pulmonary embolus or acute intrathoracic abnormality. 2. Shotty right hilar node is likely reactive. 3. Tiny hiatal hernia. Electronically Signed   By: Narda Rutherford M.D.   On: 01/27/2020 23:11    Procedures Procedures (including critical care time)  Medications Ordered in ED Medications  iohexol (OMNIPAQUE) 350 MG/ML injection 100 mL (100 mLs Intravenous Contrast Given 01/27/20 2259)    ED Course  I have reviewed the triage vital signs and the nursing notes.  Pertinent labs & imaging results that were  available during my care of the patient were reviewed by me and considered in my medical decision making (see chart for details).    MDM Rules/Calculators/A&P                          Patient presents the ED with concern for pulmonary embolism.  I reviewed her chart and she had a subsegmental PE in 2016 and was ultimately discharged home with anticoagulation.  She has not had any significant follow-up with primary care and has only seen her OB/GYN for pregnancies, at which point she has been restarted on heparin.  She states this feels similar to her PE in 2016 which prompted her to come to the ED for evaluation.  She been treating her back/chest discomfort with muscle relaxants, without relief.  Discussed case with Dr. Deretha Emory, rather than obtain D-Dimer will proceed to CTA of the chest given hx of PE and her report that this feels similar.  Discussed risks of repeated CT exposures.  Patient would like to proceed.    CTA chest is personally reviewed and demonstrates no pulmonary embolism or other acute intrathoracic abnormality.  Patient is reassured by today's comprehensive work-up.  She will follow up with a primary care provider for ongoing evaluation and management.  She admits that she has illness anxiety disorder after having her pulmonary embolism back in 2016.  ED return precautions discussed.  Patient voices understanding and is agreeable to the plan.  Final Clinical Impression(s) / ED Diagnoses Final diagnoses:  Nonspecific chest pain    Rx / DC Orders ED Discharge Orders    None       Lorelee New, PA-C 01/28/20 0011    Vanetta Mulders, MD 02/24/20 2036

## 2020-01-27 NOTE — ED Triage Notes (Signed)
Reports hx of left sided chest pain.  Initially had left upper back pain about a month ago.  Seen at UC placed on muscle relaxer.  Reports it didn't get any better now haivng pain in left chest.  Hx of DVT and PE.  Not on any thinners currently.

## 2020-01-28 NOTE — Discharge Instructions (Addendum)
Please get established with a primary care provider soon as possible so that you may have ongoing evaluation and management of your health and wellbeing.  I suspect that there is an element of illness anxiety which is entirely understandable given you have gone through over the course of the past few years.  Regardless, I would like for you to discuss this further with a primary care provider and perhaps even discuss with a therapist.  Your work-up today was reassuring and without any concern for pulmonary embolism or other acute cardiopulmonary disease.  You may continue with over-the-counter NSAIDs or muscle actions as needed for muscular discomfort.  Return to the ED or seek immediate medical attention should you experience any new or worsening symptoms.

## 2020-06-06 ENCOUNTER — Emergency Department (HOSPITAL_BASED_OUTPATIENT_CLINIC_OR_DEPARTMENT_OTHER)
Admission: EM | Admit: 2020-06-06 | Discharge: 2020-06-06 | Disposition: A | Discharge status: Home / Self Care | Payer: Medicaid Other | Attending: Emergency Medicine | Admitting: Emergency Medicine

## 2020-06-06 ENCOUNTER — Other Ambulatory Visit: Payer: Self-pay

## 2020-06-06 ENCOUNTER — Emergency Department (HOSPITAL_BASED_OUTPATIENT_CLINIC_OR_DEPARTMENT_OTHER): Payer: Medicaid Other

## 2020-06-06 ENCOUNTER — Encounter (HOSPITAL_BASED_OUTPATIENT_CLINIC_OR_DEPARTMENT_OTHER): Payer: Self-pay

## 2020-06-06 DIAGNOSIS — I10 Essential (primary) hypertension: Secondary | ICD-10-CM | POA: Insufficient documentation

## 2020-06-06 DIAGNOSIS — J4540 Moderate persistent asthma, uncomplicated: Secondary | ICD-10-CM | POA: Insufficient documentation

## 2020-06-06 DIAGNOSIS — M79672 Pain in left foot: Secondary | ICD-10-CM | POA: Diagnosis not present

## 2020-06-06 DIAGNOSIS — Z79899 Other long term (current) drug therapy: Secondary | ICD-10-CM | POA: Insufficient documentation

## 2020-06-06 DIAGNOSIS — F17210 Nicotine dependence, cigarettes, uncomplicated: Secondary | ICD-10-CM | POA: Diagnosis not present

## 2020-06-06 DIAGNOSIS — E039 Hypothyroidism, unspecified: Secondary | ICD-10-CM | POA: Diagnosis not present

## 2020-06-06 NOTE — Discharge Instructions (Addendum)
You are seen in the emergency department for left foot pain.  You had an ultrasound that did not show any evidence of a blood clot.  You should ice the area and take ibuprofen.  Elevate.  Follow-up with your doctor.

## 2020-06-06 NOTE — ED Provider Notes (Signed)
[facility] EMERGENCY DEPARTMENT Provider Note   CSN: [id number] Arrival date & time: 06/06/20  1946     History Chief Complaint  Patient presents with  . Foot Pain    Grace Simpson is a 35 y.o. female.  She noticed some pain in her left foot that started about 5 hours ago.  It shoots up her calf..  She noticed the vein on that foot bulging and she was afraid she might have a DVT.  She has a history of DVT and is not on anticoagulation.  No chest pain or shortness of breath.  No trauma.   Foot Pain This is a new problem. The current episode started 3 to 5 hours ago. The problem has not changed since onset.Pertinent negatives include no chest pain, no abdominal pain, no headaches and no shortness of breath. The symptoms are aggravated by walking. Nothing relieves the symptoms. She has tried rest for the symptoms. The treatment provided no relief.       Past Medical History:  Diagnosis Date  . Anemia   . Anxiety    no meds currently  . Anxiety   . Bipolar affective (HCC)   . BV (bacterial vaginosis)   . 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 GERD (gastroesophageal reflux disease)   . Headache(784.0)   . Hemorrhoids   . Herpes    last outbreak 4 months ago  . History of pyelonephritis   . Hx pulmonary embolism 05/2014  . Hyperprolactinemia (HCC)   . Hypertension   . Hypothyroidism   . Left breast abscess    not MRSA  . Migraines   . Obesity, morbid (more than 100 lbs over ideal weight or BMI > 40) (HCC)   . Obstructive sleep apnea    does not use CPAP  . OCD (obsessive compulsive disorder)   . Oppositional defiant disorder   . Pregnancy induced hypertension    unsure if PIH or CHTN - no meds  . Pseudotumor cerebri    increased CSFi pressure on optic nerve, back to nl level  . Pyelonephritis    history - no current problem  . S/P cesarean section 10/23/2012  . Seasonal allergies   . Trichomonas   . UTI (lower urinary tract  infection)     Patient Active Problem List   Diagnosis Date Noted  . Pregnant and not yet delivered in third trimester 02/02/2017  . History of pulmonary embolism 02/02/2017  . S/P repeat low transverse C-section 02/02/2017  . Chronic rhinitis 01/04/2015  . Moderate persistent asthma 12/03/2014  . GERD (gastroesophageal reflux disease) 12/03/2014  . Pulmonary embolism (HCC) 06/14/2014  . Acute pulmonary embolism (HCC) 06/14/2014  . S/P cesarean section 10/23/2012  . Chest pain 09/21/2012  . Dyspnea 09/21/2012  . Hypothyroidism 03/08/2009  . OBSTRUCTIVE SLEEP APNEA 03/08/2009  . Other allergic rhinitis 03/08/2009  . OBESITY, MORBID 09/13/2008  . BIPOLAR DISORDER UNSPECIFIED 09/13/2008  . OBSESSIVE-COMPULSIVE DISORDERS 09/13/2008  . OPPOSITIONAL DEFIANT DISORDER 09/13/2008  . ADD 09/13/2008    Past Surgical History:  Procedure Laterality Date  . ADENOIDECTOMY    . CESAREAN SECTION  11/20/2010   Procedure: CESAREAN SECTION;  Surgeon: Oliver Pila;  Location: [facility];  Service: Gynecology;  Laterality: N/A;  . CESAREAN SECTION N/A 10/21/2012   Procedure: REPEAT CESAREAN SECTION;  Surgeon: Oliver Pila, MD;  Location: [facility];  Service: Obstetrics;  Laterality: N/A;  . CESAREAN SECTION N/A 02/02/2017   Procedure: CESAREAN  SECTION;  Surgeon: Huel Cote, MD;  Location: [facility];  Service: Obstetrics;  Laterality: N/A;  Tracey RNFA assist and 2 hours in OR  . CHOLECYSTECTOMY  2009  . left breast abcess Left 2010  . TONSILLECTOMY  1997  . WISDOM TOOTH EXTRACTION       OB History    Gravida  3   Para  3   Term  3   Preterm  0   AB  0   Living  3     SAB  0   IAB  0   Ectopic  0   Multiple  0   Live Births  3           Family History  Adopted: Yes  Family history unknown: Yes    Social History   Tobacco Use  . Smoking status: Current Every Day Smoker    Types: Cigarettes  . Smokeless tobacco: Never Used  Substance Use  Topics  . Alcohol use: No    Alcohol/week: 0.0 standard drinks  . Drug use: No    Home Medications Prior to Admission medications   Medication Sig Start Date End Date Taking? Authorizing Provider  Doxylamine-Pyridoxine 10-10 MG TBEC Take 2 tablets by mouth at bedtime.    [provider]  enoxaparin (LOVENOX) 120 MG/0.8ML injection Inject 120 mg into the skin every 12 (twelve) hours.    [provider]  hydrOXYzine (VISTARIL) 25 MG capsule Take 25 mg by mouth 3 (three) times daily as needed for anxiety.     [provider]  levothyroxine (SYNTHROID, LEVOTHROID) 137 MCG tablet Take 137 mcg by mouth daily before breakfast.    [provider]  loratadine (CLARITIN) 10 MG tablet Take 10 mg by mouth daily as needed for allergies.     [provider]  omeprazole (PRILOSEC) 40 MG capsule TAKE ONE CAPSULE BY MOUTH DAILY Patient not taking: No sig reported 05/28/15   Bobbitt, Heywood Iles, MD  oxymetazoline (AFRIN) 0.05 % nasal spray Place 1 spray into both nostrils daily as needed for congestion.    [provider]    Allergies    Corticosteroids, Food, Diphenhydramine hcl, and Sulfa antibiotics  Review of Systems   Review of Systems  Constitutional: Negative for fever.  Respiratory: Negative for shortness of breath.   Cardiovascular: Positive for leg swelling. Negative for chest pain.  Gastrointestinal: Negative for abdominal pain.  Skin: Negative for wound.  Neurological: Negative for headaches.    Physical Exam Updated Vital Signs BP (!) 154/101 (BP Location: Left Arm)   Pulse 89   Temp 98.8 F (37.1 C) (Oral)   Resp 16   Ht 5' (1.524 m)   Wt 118.8 kg   LMP 05/10/2020   SpO2 100%   BMI 51.17 kg/m   Physical Exam Vitals and nursing note reviewed.  Constitutional:      Appearance: She is well-developed.  HENT:     Head: Normocephalic and atraumatic.  Eyes:     Conjunctiva/sclera: Conjunctivae normal.  Musculoskeletal:         General: Tenderness present. Normal range of motion.     Cervical back: Neck supple.     Right lower leg: No edema.     Left lower leg: No edema.     Comments: She has some tenderness of the lateral proximal foot.  Ankle nontender.  She declines an x-ray.  Not appreciate any cords or calf edema.  Distal pulses and sensation intact.  Skin:    General: Skin is warm and dry.     Capillary Refill: Capillary refill takes less than 2 seconds.  Neurological:     General: No focal deficit present.     Mental Status: She is alert.     GCS: GCS eye subscore is 4. GCS verbal subscore is 5. GCS motor subscore is 6.     ED Results / Procedures / Treatments   Labs (all labs ordered are listed, but only abnormal results are displayed) Labs Reviewed - No data to display  EKG None  Radiology US Venous Img Lower  Left (DVT Study)  Result Date: 06/06/2020 CLINICAL DATA:  Leg swell EXAM: LEFT LOWER EXTREMITY VENOUS DOPPLER ULTRASOUND TECHNIQUE: Gray-scale sonography with compression, as well as color and duplex ultrasound, were performed to evaluate the deep venous system(s) from the level of the common femoral vein through the popliteal and proximal calf veins. COMPARISON:  None. FINDINGS: VENOUS Normal compressibility of the common femoral, superficial femoral, and popliteal veins, as well as the visualized calf veins. Visualized portions of profunda femoral vein and great saphenous vein unremarkable. No filling defects to suggest DVT on grayscale or color Doppler imaging. Doppler waveforms show normal direction of venous flow, normal respiratory plasticity and response to augmentation. Limited views of the contralateral common femoral vein are unremarkable. OTHER None. Limitations: none IMPRESSION: Negative. Electronically Signed   By: Charlett Nose M.D.   On: 06/06/2020 21:14    Procedures Procedures   Medications Ordered in ED Medications - No data to display  ED Course  I have reviewed  the triage vital signs and the nursing notes.  Pertinent labs & imaging results that were available during my care of the patient were reviewed by me and considered in my medical decision making (see chart for details).    MDM Rules/Calculators/A&P                         Differential diagnosis includes contusion, sprain, DVT, gout Final Clinical Impression(s) / ED Diagnoses Final diagnoses:  Foot pain, left    Rx / DC Orders ED Discharge Orders    None       Terrilee Files, MD 06/07/20 [phone]

## 2020-06-06 NOTE — ED Triage Notes (Signed)
Pt c/o pain, swelling to left foot x 5 hours-denies injury-concerned due to she has hx of DVT and PE-NAD-steady gait

## 2020-06-06 NOTE — ED Notes (Signed)
Discharge instructions discussed with pt. Pt verbalized understanding. Pt stable and ambulatory. No signature pad available. 

## 2021-01-17 ENCOUNTER — Emergency Department (HOSPITAL_BASED_OUTPATIENT_CLINIC_OR_DEPARTMENT_OTHER)
Admission: EM | Admit: 2021-01-17 | Discharge: 2021-01-17 | Disposition: A | Discharge status: Home / Self Care | Payer: Medicaid Other | Attending: Emergency Medicine | Admitting: Emergency Medicine

## 2021-01-17 ENCOUNTER — Encounter (HOSPITAL_BASED_OUTPATIENT_CLINIC_OR_DEPARTMENT_OTHER): Payer: Self-pay | Admitting: *Deleted

## 2021-01-17 ENCOUNTER — Other Ambulatory Visit: Payer: Self-pay

## 2021-01-17 ENCOUNTER — Emergency Department (HOSPITAL_BASED_OUTPATIENT_CLINIC_OR_DEPARTMENT_OTHER): Payer: Medicaid Other

## 2021-01-17 DIAGNOSIS — Z79899 Other long term (current) drug therapy: Secondary | ICD-10-CM | POA: Diagnosis not present

## 2021-01-17 DIAGNOSIS — I10 Essential (primary) hypertension: Secondary | ICD-10-CM | POA: Insufficient documentation

## 2021-01-17 DIAGNOSIS — M5412 Radiculopathy, cervical region: Secondary | ICD-10-CM | POA: Diagnosis not present

## 2021-01-17 DIAGNOSIS — E039 Hypothyroidism, unspecified: Secondary | ICD-10-CM | POA: Diagnosis not present

## 2021-01-17 DIAGNOSIS — F17210 Nicotine dependence, cigarettes, uncomplicated: Secondary | ICD-10-CM | POA: Diagnosis not present

## 2021-01-17 DIAGNOSIS — R0602 Shortness of breath: Secondary | ICD-10-CM | POA: Insufficient documentation

## 2021-01-17 DIAGNOSIS — M25512 Pain in left shoulder: Secondary | ICD-10-CM | POA: Diagnosis not present

## 2021-01-17 DIAGNOSIS — R209 Unspecified disturbances of skin sensation: Secondary | ICD-10-CM | POA: Diagnosis present

## 2021-01-17 LAB — D-DIMER, QUANTITATIVE: D-Dimer, Quant: <0.27 ug/mL-FEU (ref 0.00–0.50)

## 2021-01-17 MED ORDER — KETOROLAC TROMETHAMINE 60 MG/2ML IM SOLN
30.0000 mg | Freq: Once | INTRAMUSCULAR | Status: AC
  Administered 2021-01-17: 30 mg via INTRAMUSCULAR
  Filled 2021-01-17: qty 2

## 2021-01-17 NOTE — ED Triage Notes (Signed)
Numbness in bilateral hands,  worse in left hand and arm x 1.5 weeks,  some left shoulder blade pain

## 2021-01-17 NOTE — ED Provider Notes (Signed)
[facility] EMERGENCY DEPARTMENT Provider Note   CSN: [id number] Arrival date & time: 01/17/21  0857     History Chief Complaint  Patient presents with   Numbness    Grace Simpson is a 35 y.o. female presents emergency department for evaluation of left hand/arm numbness tingling for the past 2 weeks.  Patient reports over the past few days it is also been in her right hand.  She denies any traumas, falls, MVC's.  Denies any heavy lifting or new activity.  She denies any chest pain or shortness of breath.  Additionally, she mentions to having some left shoulder pain for the past 2 weeks that "felt the last time she had a blood clot".  She mentions she does not have the pleuritic chest pain that she did with her previous blood clot, but she is experiencing mild shortness of breath on exertion..  Patient is still a smoker, but now uses nonhormonal birth control.  Medical history includes pseudotumor cerebri, hypertension, PE in 2015 or 2016, and hypothyroidism.  Not pertinent surgical history.  Patient does not take any daily medications that she has not follow-up with her PCP.  Allergic to steroids, sulfa antibiotics, and Benadryl.  She is 1/2 pack/day smoker for the past 10 years, but denies any EtOH or drug use.  HPI     Past Medical History:  Diagnosis Date   Anemia    Anxiety    no meds currently   Anxiety    Bipolar affective ([place])    BV (bacterial vaginosis)    Depression    not bipolar stemming from detachment disorder   GERD (gastroesophageal reflux disease)    Headache(784.0)    Hemorrhoids    Herpes    last outbreak 4 months ago   History of pyelonephritis    Hx pulmonary embolism 05/2014   Hyperprolactinemia ([place])    Hypertension    Hypothyroidism    Left breast abscess    not MRSA   Migraines    Obesity, morbid (more than 100 lbs over ideal weight or BMI > 40) ([place])    Obstructive sleep apnea    does not use CPAP   OCD (obsessive compulsive disorder)     Oppositional defiant disorder    Pregnancy induced hypertension    unsure if PIH or CHTN - no meds   Pseudotumor cerebri    increased CSFi pressure on optic nerve, back to nl level   Pyelonephritis    history - no current problem   S/P cesarean section 10/23/2012   Seasonal allergies    Trichomonas    UTI (lower urinary tract infection)     Patient Active Problem List   Diagnosis Date Noted   Pregnant and not yet delivered in third trimester 02/02/2017   History of pulmonary embolism 02/02/2017   S/P repeat low transverse C-section 02/02/2017   Chronic rhinitis 01/04/2015   Moderate persistent asthma 12/03/2014   GERD (gastroesophageal reflux disease) 12/03/2014   Pulmonary embolism ([place]) 06/14/2014   Acute pulmonary embolism ([place]) 06/14/2014   S/P cesarean section 10/23/2012   Chest pain 09/21/2012   Dyspnea 09/21/2012   Hypothyroidism 03/08/2009   OBSTRUCTIVE SLEEP APNEA 03/08/2009   Other allergic rhinitis 03/08/2009   OBESITY, MORBID 09/13/2008   BIPOLAR DISORDER UNSPECIFIED 09/13/2008   OBSESSIVE-COMPULSIVE DISORDERS 09/13/2008   OPPOSITIONAL DEFIANT DISORDER 09/13/2008   ADD 09/13/2008    Past Surgical History:  Procedure Laterality Date   ADENOIDECTOMY     CESAREAN SECTION  11/20/2010  Procedure: CESAREAN SECTION;  Surgeon: Logan Bores;  Location: [facility];  Service: Gynecology;  Laterality: N/A;   CESAREAN SECTION N/A 10/21/2012   Procedure: REPEAT CESAREAN SECTION;  Surgeon: Logan Bores, MD;  Location: [facility];  Service: Obstetrics;  Laterality: N/A;   CESAREAN SECTION N/A 02/02/2017   Procedure: CESAREAN SECTION;  Surgeon: Paula Compton, MD;  Location: [facility];  Service: Obstetrics;  Laterality: N/A;  Tracey RNFA assist and 2 hours in [place]  2009   left breast abcess Left 2010   TONSILLECTOMY  1997   WISDOM TOOTH EXTRACTION       OB History     Gravida  3   Para  3   Term  3   Preterm  0   AB  0   Living   3      SAB  0   IAB  0   Ectopic  0   Multiple  0   Live Births  3           Family History  Adopted: Yes  Family history unknown: Yes    Social History   Tobacco Use   Smoking status: Every Day    Types: Cigarettes   Smokeless tobacco: Never  Substance Use Topics   Alcohol use: No    Alcohol/week: 0.0 standard drinks   Drug use: No    Home Medications Prior to Admission medications   Medication Sig Start Date End Date Taking? Authorizing Provider  Doxylamine-Pyridoxine 10-10 MG TBEC Take 2 tablets by mouth at bedtime.    [provider]  enoxaparin (LOVENOX) 120 MG/0.8ML injection Inject 120 mg into the skin every 12 (twelve) hours.    [provider]  hydrOXYzine (VISTARIL) 25 MG capsule Take 25 mg by mouth 3 (three) times daily as needed for anxiety.     [provider]  levothyroxine (SYNTHROID, LEVOTHROID) 137 MCG tablet Take 137 mcg by mouth daily before breakfast.    [provider]  loratadine (CLARITIN) 10 MG tablet Take 10 mg by mouth daily as needed for allergies.     [provider]  omeprazole (PRILOSEC) 40 MG capsule TAKE ONE CAPSULE BY MOUTH DAILY Patient not taking: No sig reported 05/28/15   Bobbitt, Sedalia Muta, MD  oxymetazoline (AFRIN) 0.05 % nasal spray Place 1 spray into both nostrils daily as needed for congestion.    [provider]    Allergies    Corticosteroids, Food, Diphenhydramine hcl, and Sulfa antibiotics  Review of Systems   Review of Systems  Constitutional:  Positive for fatigue. Negative for chills and fever.  HENT:  Negative for ear pain and sore throat.   Eyes:  Negative for pain and visual disturbance.  Respiratory:  Negative for cough and shortness of breath.   Cardiovascular:  Negative for chest pain and palpitations.  Gastrointestinal:  Negative for abdominal pain and vomiting.  Genitourinary:  Negative for dysuria and hematuria.  Musculoskeletal:  Positive for  myalgias. Negative for arthralgias, back pain, joint swelling, neck pain and neck stiffness.  Skin:  Negative for color change and rash.  Neurological:  Positive for numbness. Negative for dizziness, seizures, syncope, weakness and headaches.  All other systems reviewed and are negative.  Physical Exam Updated Vital Signs BP (!) 180/93 (BP Location: Right Arm)   Pulse 80   Temp 98.5 F (36.9 C) (Oral)   Resp 18   Ht 5' (1.524 m)   Wt 118.8 kg  LMP 01/16/2021   SpO2 99%   BMI 51.17 kg/m   Physical Exam Vitals and nursing note reviewed.  Constitutional:      General: She is not in acute distress.    Appearance: Normal appearance. She is not toxic-appearing.  HENT:     Head: Normocephalic and atraumatic.     Mouth/Throat:     Mouth: Mucous membranes are moist.  Eyes:     General: No scleral icterus. Neck:     Comments: Full range of motion of neck.  Acanthosis nigricans noted to posterior neck, otherwise no overlying skin changes, ecchymosis, erythema, rash, or abrasion noted to the area.  Patient is tender to bilateral paraspinal cervical spine.  No palpable muscle spasms.  No midline tenderness. Cardiovascular:     Rate and Rhythm: Normal rate and regular rhythm.  Pulmonary:     Effort: Pulmonary effort is normal. No respiratory distress.  Abdominal:     General: Abdomen is flat. Bowel sounds are normal.     Palpations: Abdomen is soft.  Musculoskeletal:        General: No swelling, tenderness, deformity or signs of injury. Normal range of motion.     Cervical back: Normal range of motion and neck supple. No rigidity.     Comments: No cervical, thoracic, lumbar, or sacral spinal tenderness.  Paraspinal cervical spine tenderness.  No obvious deformities or step-offs noted.  Patient does have a palpable fat pad to the posterior neck with acanthosis nigricans.  Full range of motion of the neck otherwise.  Patient reports that she does feel a little bit of the tingling whenever  she flexes her neck forward.  She has full range of motion of her shoulders, elbows, and wrist.  Radial pulses intact.  Compartments soft.  Patient reports she has some decrease in sensation to the dorsum of her left hand.  She reports her pain is not reproducible by palpation.  She has some decreased grip strength on the left, but not with resistance.  Skin:    General: Skin is warm and dry.     Capillary Refill: Capillary refill takes less than 2 seconds.  Neurological:     General: No focal deficit present.     Mental Status: She is alert. Mental status is at baseline.     Motor: No weakness.     Gait: Gait normal.     Comments: No pronator drift    ED Results / Procedures / Treatments   Labs (all labs ordered are listed, but only abnormal results are displayed) Labs Reviewed  D-DIMER, QUANTITATIVE    EKG None  Radiology DG Chest 2 View  Result Date: 01/17/2021 CLINICAL DATA:  Chest and shoulder pain EXAM: CHEST - 2 VIEW COMPARISON:  01/27/2020 FINDINGS: The heart size and mediastinal contours are within normal limits. Both lungs are clear. The visualized skeletal structures are unremarkable. IMPRESSION: No active cardiopulmonary disease. Electronically Signed   By: Judie Petit.  Shick M.D.   On: 01/17/2021 10:57   DG Shoulder Left  Result Date: 01/17/2021 CLINICAL DATA:  Shoulder pain EXAM: LEFT SHOULDER - 2+ VIEW COMPARISON:  None. FINDINGS: There is no evidence of fracture or dislocation. There is no evidence of arthropathy or other focal bone abnormality. Soft tissues are unremarkable. IMPRESSION: Negative. Electronically Signed   By: Judie Petit.  Shick M.D.   On: 01/17/2021 10:59    Procedures Procedures   Medications Ordered in ED Medications  ketorolac (TORADOL) injection 30 mg (has no administration in time range)  ED Course  I have reviewed the triage vital signs and the nursing notes.  Pertinent labs & imaging results that were available during my care of the patient were  reviewed by me and considered in my medical decision making (see chart for details).  35 year old female presents emergency department for evaluation of left greater than right hand numbness with intermittent tingling for the past 2 weeks.  Differential diagnosis includes was not limited to cervical radiculopathy, stroke, carpal tunnel, cubital tunnel, MSK, PE.  Low decision for stroke as patient does not have any weakness with resistance.  No focal deficit.  And given the time length of symptoms.  On exam, she does have decreased grip strength of the left, but not with resistance.  She reports she has some decrease sensation over the dorsum of her left hand.  I was unable to reproduce the pain with palpation, although patient reports that when she flexes her neck she does reproduce some tingling to her left arm.  With her physical exam and interview, this sounds more likely due to cervical radiculopathy.  Her chest x-ray and left shoulder x-ray were unremarkable.  Her D-dimer was negative.  Low suspicion for PE at this time.  I discussed these lab and imaging findings with the patient.  Recommended follow-up with PCP with a blood pressure.  Follow-up with neurosurgery given.  She is allergic to steroids, so recommended NSAIDs such as ibuprofen for pain.  Return precautions discussed.  Patient agrees to plan.  Patient is stable being discharged home in good condition.  I discussed this case with my attending physician who cosigned this note including patient's presenting symptoms, physical exam, and planned diagnostics and interventions. Attending physician stated agreement with plan or made changes to plan which were implemented.     MDM Rules/Calculators/A&P                          Final Clinical Impression(s) / ED Diagnoses Final diagnoses:  Cervical radiculopathy    Rx / DC Orders ED Discharge Orders     None        Sherrell Puller, Hershal Coria 01/17/21 1235    Davonna Belling,  MD 01/18/21 1850

## 2021-01-17 NOTE — Discharge Instructions (Addendum)
You were seen today for your left arm numbness and tingling.  That you are concerned for another blood clot in your lungs, however your D-dimer was negative.  There is a very low suspicion for PE at this time.  Although, if you experiencing any worsening shortness of breath, chest pain, please return to the nearest emergency department for reevaluation.  Please follow-up with the PCP for evaluation of your high blood pressure and to get back on your medications for your pseudotumor cerebri and hypothyroidism.  A PCP has been referred to you, please call to schedule an appointment.  Additionally, I referral to neurosurgery is in place, please return call to schedule an appointment.  You are allergic to steroids, you can take ibuprofen and/or Tylenol as needed for pain.  If you have any concern, new or worsening symptoms, please return to the nearest emergency department for evaluation.

## 2021-07-31 ENCOUNTER — Encounter (HOSPITAL_BASED_OUTPATIENT_CLINIC_OR_DEPARTMENT_OTHER): Payer: Self-pay | Admitting: Urology

## 2021-07-31 ENCOUNTER — Emergency Department (HOSPITAL_BASED_OUTPATIENT_CLINIC_OR_DEPARTMENT_OTHER): Payer: Medicaid Other

## 2021-07-31 ENCOUNTER — Other Ambulatory Visit: Payer: Self-pay

## 2021-07-31 ENCOUNTER — Emergency Department (HOSPITAL_BASED_OUTPATIENT_CLINIC_OR_DEPARTMENT_OTHER)
Admission: EM | Admit: 2021-07-31 | Discharge: 2021-07-31 | Disposition: A | Discharge status: Home / Self Care | Payer: Medicaid Other | Attending: Emergency Medicine | Admitting: Emergency Medicine

## 2021-07-31 DIAGNOSIS — M79605 Pain in left leg: Secondary | ICD-10-CM | POA: Diagnosis present

## 2021-07-31 DIAGNOSIS — Z79899 Other long term (current) drug therapy: Secondary | ICD-10-CM | POA: Insufficient documentation

## 2021-07-31 DIAGNOSIS — I10 Essential (primary) hypertension: Secondary | ICD-10-CM | POA: Insufficient documentation

## 2021-07-31 DIAGNOSIS — E039 Hypothyroidism, unspecified: Secondary | ICD-10-CM | POA: Diagnosis not present

## 2021-07-31 DIAGNOSIS — M79672 Pain in left foot: Secondary | ICD-10-CM | POA: Diagnosis not present

## 2021-07-31 NOTE — ED Provider Notes (Signed)
[facility] EMERGENCY DEPARTMENT Provider Note   CSN: [id number] Arrival date & time: 07/31/21  1413     History  Chief Complaint  Patient presents with   Leg Pain    Grace Simpson is a 36 y.o. female who presents the emergency department complaining of left foot pain since yesterday.  Patient states that she had noticed of vein bulging on the top of her foot, as well as some associated swelling.  Reported that the pain radiated to her her ankle and her calf.  She elevated her leg felt a little bit better afterwards.  Has a history of DVT and PE, which made her concerned about blood clots today.  She also has a history of elevated blood pressure, states she does not currently have a PCP for blood pressure management.  No headache, visual disturbance, or chest pain.  Leg Pain      Home Medications Prior to Admission medications   Medication Sig Start Date End Date Taking? Authorizing Provider  Doxylamine-Pyridoxine 10-10 MG TBEC Take 2 tablets by mouth at bedtime.    [provider]  enoxaparin (LOVENOX) 120 MG/0.8ML injection Inject 120 mg into the skin every 12 (twelve) hours.    [provider]  hydrOXYzine (VISTARIL) 25 MG capsule Take 25 mg by mouth 3 (three) times daily as needed for anxiety.     [provider]  levothyroxine (SYNTHROID, LEVOTHROID) 137 MCG tablet Take 137 mcg by mouth daily before breakfast.    [provider]  loratadine (CLARITIN) 10 MG tablet Take 10 mg by mouth daily as needed for allergies.     [provider]  omeprazole (PRILOSEC) 40 MG capsule TAKE ONE CAPSULE BY MOUTH DAILY Patient not taking: No sig reported 05/28/15   Bobbitt, Heywood Iles, MD  oxymetazoline (AFRIN) 0.05 % nasal spray Place 1 spray into both nostrils daily as needed for congestion.    [provider]      Allergies    Corticosteroids, Food, Diphenhydramine hcl, and Sulfa antibiotics    Review of Systems   Review of  Systems  Eyes:  Negative for visual disturbance.  Cardiovascular:  Positive for leg swelling. Negative for chest pain.  All other systems reviewed and are negative.   Physical Exam Updated Vital Signs BP (!) 178/96 (BP Location: Right Arm)   Pulse 86   Temp 98.6 F (37 C) (Oral)   Resp 18   Ht 5' (1.524 m)   Wt 120.2 kg   LMP 07/27/2021 (Exact Date)   SpO2 99%   BMI 51.75 kg/m  Physical Exam Vitals and nursing note reviewed.  Constitutional:      Appearance: Normal appearance.  HENT:     Head: Normocephalic and atraumatic.  Eyes:     Conjunctiva/sclera: Conjunctivae normal.  Cardiovascular:     Pulses:          Posterior tibial pulses are 2+ on the right side and 2+ on the left side.  Pulmonary:     Effort: Pulmonary effort is normal. No respiratory distress.  Musculoskeletal:     Right lower leg: No edema.     Left lower leg: No edema.  Skin:    General: Skin is warm and dry.  Neurological:     Mental Status: She is alert.  Psychiatric:        Mood and Affect: Mood normal.        Behavior: Behavior normal.     ED Results / Procedures / Treatments  Labs (all labs ordered are listed, but only abnormal results are displayed) Labs Reviewed - No data to display  EKG None  Radiology US Venous Img Lower Unilateral Left  Result Date: 07/31/2021 CLINICAL DATA:  Left calf pain. EXAM: LEFT LOWER EXTREMITY VENOUS DOPPLER ULTRASOUND TECHNIQUE: Gray-scale sonography with compression, as well as color and duplex ultrasound, were performed to evaluate the deep venous system(s) from the level of the common femoral vein through the popliteal and proximal calf veins. COMPARISON:  None Available. FINDINGS: VENOUS Normal compressibility of the common femoral, superficial femoral, and popliteal veins, as well as the visualized calf veins. Visualized portions of profunda femoral vein and great saphenous vein unremarkable. No filling defects to suggest DVT on grayscale or color  Doppler imaging. Doppler waveforms show normal direction of venous flow, normal respiratory plasticity and response to augmentation. Limited views of the contralateral common femoral vein are unremarkable. OTHER None. Limitations: none IMPRESSION: Negative. Electronically Signed   By: Darliss Cheney M.D.   On: 07/31/2021 15:10    Procedures Procedures    Medications Ordered in ED Medications - No data to display  ED Course/ Medical Decision Making/ A&P                           Medical Decision Making This patient is a 36 y.o. female  who presents to the ED for concern of left leg pain.   Differential diagnoses prior to evaluation: The emergent differential diagnosis includes, but is not limited to, DVT, muscular strain, ankle sprain, ligamentous injury, cellulitis.  This is not an exhaustive differential.   Past Medical History / Co-morbidities: Hypothyroidism, bipolar affective disorder, hypertension, pseudotumor cerebri, history of DVT and PE in 2016  Physical Exam: Physical exam performed. The pertinent findings include: No significant lower leg edema.  Normal pulses bilaterally.  Lab Tests/Imaging studies: I personally interpreted labs/imaging and the pertinent results include: No evidence of DVT on ultrasound. I agree with the radiologist interpretation.   Disposition: After consideration of the diagnostic results and the patients response to treatment, I feel that patient's not requiring admission.  Discussed the results of the ultrasound.  I think patient's symptoms are likely related to spending a lot of time on her feet/overuse.  We discussed her blood pressure management.  As she has never been on antihypertensives before, we had a risk versus benefit discussion of starting her on some today.  I explained that I would feel more comfortable if she had a PCP do this, and she plans to make an appointment with one.  Discussed reasons to return to the emergency department, and the  patient is agreeable to the plan.   Final Clinical Impression(s) / ED Diagnoses Final diagnoses:  Left leg pain    Rx / DC Orders ED Discharge Orders     None      Portions of this report may have been transcribed using voice recognition software. Every effort was made to ensure accuracy; however, inadvertent computerized transcription errors may be present.    Su Monks, PA-C 07/31/21 1815    Sloan Leiter, DO 08/02/21 0125

## 2021-07-31 NOTE — Discharge Instructions (Addendum)
You were seen in the emergency department today for leg pain.  As we discussed, your ultrasound showed no blood clots.  I think that your pain could be related to being on your feet, and overuse.  In terms of your blood pressure, it is very important to follow-up with a primary doctor about this.  There is some risk versus benefit with starting you on blood pressure medication.  But in the long run I think that you need it.

## 2021-07-31 NOTE — ED Triage Notes (Signed)
Pain in left foot since yesterday, states bulging vein and swelling, states pain radiating to calf and ankle States h/o DVT  BP elevated but states does not have PCP for BP management
